# Patient Record
Sex: Female | Born: 1950 | Race: White | Hispanic: No | Marital: Married | Smoking: Never smoker
Health system: Southern US, Community
[De-identification: ages and names within clinical notes are randomized; demographics above are authoritative.]

## PROBLEM LIST (undated history)

## (undated) DIAGNOSIS — G8929 Other chronic pain: Secondary | ICD-10-CM

## (undated) DIAGNOSIS — J189 Pneumonia, unspecified organism: Secondary | ICD-10-CM

## (undated) DIAGNOSIS — R0602 Shortness of breath: Secondary | ICD-10-CM

## (undated) DIAGNOSIS — M1990 Unspecified osteoarthritis, unspecified site: Secondary | ICD-10-CM

## (undated) DIAGNOSIS — H9190 Unspecified hearing loss, unspecified ear: Secondary | ICD-10-CM

## (undated) DIAGNOSIS — G4730 Sleep apnea, unspecified: Secondary | ICD-10-CM

## (undated) DIAGNOSIS — N816 Rectocele: Secondary | ICD-10-CM

## (undated) DIAGNOSIS — N393 Stress incontinence (female) (male): Secondary | ICD-10-CM

## (undated) DIAGNOSIS — E039 Hypothyroidism, unspecified: Secondary | ICD-10-CM

## (undated) DIAGNOSIS — N8110 Cystocele, unspecified: Secondary | ICD-10-CM

## (undated) DIAGNOSIS — Z974 Presence of external hearing-aid: Secondary | ICD-10-CM

## (undated) DIAGNOSIS — K589 Irritable bowel syndrome without diarrhea: Secondary | ICD-10-CM

## (undated) DIAGNOSIS — Z973 Presence of spectacles and contact lenses: Secondary | ICD-10-CM

## (undated) DIAGNOSIS — E079 Disorder of thyroid, unspecified: Secondary | ICD-10-CM

## (undated) HISTORY — DX: Sleep apnea, unspecified: G47.30

## (undated) HISTORY — PX: TONSILLECTOMY: SUR1361

## (undated) HISTORY — DX: Unspecified osteoarthritis, unspecified site: M19.90

## (undated) HISTORY — DX: Shortness of breath: R06.02

## (undated) HISTORY — PX: THROAT SURGERY: SHX803

## (undated) HISTORY — DX: Hypothyroidism, unspecified: E03.9

## (undated) HISTORY — DX: Cystocele, unspecified: N81.10

## (undated) HISTORY — DX: Unspecified hearing loss, unspecified ear: H91.90

## (undated) HISTORY — DX: Rectocele: N81.6

## (undated) HISTORY — DX: Stress incontinence (female) (male): N39.3

---

## 2000-08-06 ENCOUNTER — Encounter (INDEPENDENT_AMBULATORY_CARE_PROVIDER_SITE_OTHER): Payer: Self-pay

## 2000-08-06 ENCOUNTER — Other Ambulatory Visit: Admission: RE | Admit: 2000-08-06 | Discharge: 2000-08-06 | Payer: Self-pay | Admitting: *Deleted

## 2000-12-19 ENCOUNTER — Encounter: Payer: Self-pay | Admitting: Unknown Physician Specialty

## 2000-12-19 ENCOUNTER — Ambulatory Visit (HOSPITAL_COMMUNITY): Admission: RE | Admit: 2000-12-19 | Discharge: 2000-12-19 | Payer: Self-pay | Admitting: Unknown Physician Specialty

## 2002-03-18 ENCOUNTER — Other Ambulatory Visit: Admission: RE | Admit: 2002-03-18 | Discharge: 2002-03-18 | Payer: Self-pay | Admitting: Obstetrics and Gynecology

## 2002-06-10 ENCOUNTER — Ambulatory Visit (HOSPITAL_COMMUNITY): Admission: RE | Admit: 2002-06-10 | Discharge: 2002-06-10 | Payer: Self-pay | Admitting: Family Medicine

## 2002-06-10 ENCOUNTER — Emergency Department (HOSPITAL_COMMUNITY): Admission: EM | Admit: 2002-06-10 | Discharge: 2002-06-10 | Payer: Self-pay | Admitting: Emergency Medicine

## 2002-06-10 ENCOUNTER — Encounter: Payer: Self-pay | Admitting: Family Medicine

## 2002-06-10 ENCOUNTER — Encounter: Payer: Self-pay | Admitting: Emergency Medicine

## 2002-06-25 ENCOUNTER — Encounter (HOSPITAL_COMMUNITY): Admission: RE | Admit: 2002-06-25 | Discharge: 2002-07-25 | Payer: Self-pay | Admitting: Family Medicine

## 2002-06-26 ENCOUNTER — Encounter: Payer: Self-pay | Admitting: Family Medicine

## 2003-04-16 ENCOUNTER — Other Ambulatory Visit: Admission: RE | Admit: 2003-04-16 | Discharge: 2003-04-16 | Payer: Self-pay | Admitting: Unknown Physician Specialty

## 2003-04-16 ENCOUNTER — Other Ambulatory Visit: Admission: RE | Admit: 2003-04-16 | Discharge: 2003-04-16 | Payer: Self-pay | Admitting: Obstetrics and Gynecology

## 2003-05-07 ENCOUNTER — Ambulatory Visit (HOSPITAL_COMMUNITY): Admission: RE | Admit: 2003-05-07 | Discharge: 2003-05-07 | Payer: Self-pay | Admitting: Obstetrics and Gynecology

## 2003-05-29 ENCOUNTER — Emergency Department (HOSPITAL_COMMUNITY): Admission: EM | Admit: 2003-05-29 | Discharge: 2003-05-29 | Payer: Self-pay | Admitting: Emergency Medicine

## 2004-08-31 ENCOUNTER — Ambulatory Visit (HOSPITAL_COMMUNITY): Admission: RE | Admit: 2004-08-31 | Discharge: 2004-08-31 | Payer: Self-pay | Admitting: Obstetrics and Gynecology

## 2004-08-31 ENCOUNTER — Encounter: Payer: Self-pay | Admitting: Obstetrics and Gynecology

## 2004-09-06 ENCOUNTER — Other Ambulatory Visit: Admission: RE | Admit: 2004-09-06 | Discharge: 2004-09-06 | Payer: Self-pay | Admitting: Obstetrics and Gynecology

## 2005-06-26 ENCOUNTER — Ambulatory Visit: Admission: RE | Admit: 2005-06-26 | Discharge: 2005-06-26 | Payer: Self-pay | Admitting: Family Medicine

## 2005-07-04 ENCOUNTER — Ambulatory Visit: Payer: Self-pay | Admitting: Pulmonary Disease

## 2005-10-31 ENCOUNTER — Ambulatory Visit (HOSPITAL_COMMUNITY): Admission: RE | Admit: 2005-10-31 | Discharge: 2005-10-31 | Payer: Self-pay | Admitting: Obstetrics and Gynecology

## 2005-12-22 ENCOUNTER — Other Ambulatory Visit: Admission: RE | Admit: 2005-12-22 | Discharge: 2005-12-22 | Payer: Self-pay | Admitting: Obstetrics and Gynecology

## 2006-12-20 ENCOUNTER — Ambulatory Visit (HOSPITAL_COMMUNITY): Admission: RE | Admit: 2006-12-20 | Discharge: 2006-12-20 | Payer: Self-pay | Admitting: Obstetrics and Gynecology

## 2007-04-21 ENCOUNTER — Ambulatory Visit: Admission: RE | Admit: 2007-04-21 | Discharge: 2007-04-21 | Payer: Self-pay | Admitting: Hematology & Oncology

## 2007-05-01 ENCOUNTER — Ambulatory Visit: Payer: Self-pay | Admitting: Pulmonary Disease

## 2007-06-11 ENCOUNTER — Ambulatory Visit (HOSPITAL_COMMUNITY): Admission: RE | Admit: 2007-06-11 | Discharge: 2007-06-11 | Payer: Self-pay | Admitting: Obstetrics and Gynecology

## 2008-01-22 ENCOUNTER — Ambulatory Visit (HOSPITAL_COMMUNITY): Admission: RE | Admit: 2008-01-22 | Discharge: 2008-01-22 | Payer: Self-pay | Admitting: Family Medicine

## 2009-01-26 ENCOUNTER — Ambulatory Visit (HOSPITAL_COMMUNITY): Admission: RE | Admit: 2009-01-26 | Discharge: 2009-01-26 | Payer: Self-pay | Admitting: Family Medicine

## 2009-01-27 ENCOUNTER — Ambulatory Visit (HOSPITAL_COMMUNITY): Admission: RE | Admit: 2009-01-27 | Discharge: 2009-01-27 | Payer: Self-pay | Admitting: Obstetrics and Gynecology

## 2009-08-20 ENCOUNTER — Encounter: Admission: RE | Admit: 2009-08-20 | Discharge: 2009-08-20 | Payer: Self-pay | Admitting: Otolaryngology

## 2010-02-14 ENCOUNTER — Ambulatory Visit (HOSPITAL_COMMUNITY): Admission: RE | Admit: 2010-02-14 | Discharge: 2010-02-14 | Payer: Self-pay | Admitting: Obstetrics and Gynecology

## 2010-06-05 ENCOUNTER — Encounter: Payer: Self-pay | Admitting: Obstetrics and Gynecology

## 2010-09-30 NOTE — Procedures (Signed)
Sherry Reed, NAND              ACCOUNT NO.:  [id number]   MEDICAL RECORD NO.:  [id number]          PATIENT TYPE:  OUT   LOCATION:  SLEEP LAB                     FACILITY:  [facility]   PHYSICIAN:  [name], MD,FCCPDATE OF BIRTH:  06[birth year]13[birth year]1948   DATE OF STUDY:                            NOCTURNAL POLYSOMNOGRAM   REFERRING PHYSICIAN:   REFERRING PHYSICIAN:  [name]. [name], M.D.   INDICATION FOR STUDY:  Hypersomnia with sleep apnea.   EPWORTH SLEEPINESS SCORE:  1.   SLEEP ARCHITECTURE:  The patient had a total sleep time of 201 minutes  during the entire night with very little REM or slow wave sleep.  Sleep  onset latency was prolonged at 39 minutes and REM onset was normal.  Sleep efficiency was very poor.   RESPIRATORY DATA:  The patient underwent split-night study where she was  found to have 51 obstructive events in the first 88 minutes of sleep.  This gave her an apnea/hypopnea index during the diagnostic portion of  the study at 35 events per hour.  Events occurred only in the supine  position and there was very loud snoring noted throughout.  By protocol,  she was then placed on CPAP with mask type not documented by the sleep  technician.  The pressure was initiated at 5 cm of water and ultimately  increased to 6 cm for breakthrough snoring.  The patient took  approximately 2.5 hours to get back to sleep once the CPAP mask was  placed.  She did achieve REM in the nonsupine position, but not while  supine.   OXYGEN DATA:  There was O2 desaturation during the diagnostic portion of  the study as low as 80%.   CARDIAC DATA:  Occasional PVCs, but otherwise no clinically significant  arrhythmias were noted.   MOVEMENT-PARASOMNIA:  None.   IMPRESSIONS-RECOMMENDATIONS:  1. Moderate-severe obstructive sleep apnea during the diagnostic      portion of the study with an apnea/hypopnea index of 35 events per      hour and O2 desaturation as low as 80%.  These occurred  primarily      in the supine position and very loud snoring was noted.  The      patient was then placed on CPAP and titrated to 6 cm of water      pressure with good control of her events.  However, the patient      never achieved REM in the supine position while on CPAP.  It should      also be noted the patient took 2.5 hours to get back to sleep with      CPAP.  She may need a desensitization protocol in order to improve      tolerance.  It is also unclear from the study whether 6 cm      of pressure will be adequate when she achieves supine REM.  2. Occasional PVCs, but no clinically significant arrhythmia noted.      [name], MD,FCCP  Diplomate, American Board of Sleep  Medicine  Electronically Signed     [facility]/MEDQ  D:  05/01/2007 08:00:39  T:  05/01/2007 16:10:96  Job:  [id number]

## 2010-09-30 NOTE — Procedures (Signed)
NAMEMYRLA, Sherry Reed              ACCOUNT NO.:  [id number]   MEDICAL RECORD NO.:  [id number]          PATIENT TYPE:  OUT   LOCATION:  SLEEP LAB                     FACILITY:  [facility]   PHYSICIAN:  Marcelyn Bruins, M.D. [facility] DATE OF BIRTH:  09-Feb-1951   DATE OF STUDY:  06/26/2005                              NOCTURNAL POLYSOMNOGRAM   REFERRING PHYSICIAN:  Dr. Butch Penny   DATE OF STUDY:  June 26, 2005   INDICATION FOR STUDY:  Hypersomnia with sleep apnea.   EPWORTH SCORE:  9   SLEEP ARCHITECTURE:  The patient had a total sleep time of 225 minutes with  adequate slow wave sleep but decreased REM. Sleep onset latency was  prolonged at 59 minutes and REM onset was at the lower limits of normal at  62 minutes. Sleep efficiency was very poor at 57%.   RESPIRATORY DATA:  The patient was found to have 37 hypopneas and 29 apneas  for a Respiratory Disturbance Index of 18 events per hour. The events  occurred in all body positions and there was loud snoring noted.   OXYGEN DATA:  The patient had an O2 desaturation as low as 86% with her  obstructive events.   CARDIAC DATA:  No clinically significant cardiac arrhythmia.   MOVEMENT/PARASOMNIA:  There were no clinically significant movements or  abnormal behaviors noted.   IMPRESSION/RECOMMENDATIONS:  Mild to moderate obstructive sleep apnea with a  Respiratory Disturbance Index of 18 events per hour and O2 desaturation as  low as 86%. Treatment for this degree of sleep apnea may include weight loss  alone if applicable, upper airway surgery, oral appliance, and also  continuous positive airway pressure. Clinical correlation is suggested.                                            ______________________________  Marcelyn Bruins, M.D. [facility]  Diplomate, American Board of Sleep  Medicine     KC/MEDQ  D:  07/03/2005 16:05:55  T:  07/03/2005 22:26:38  Job:  [id number]

## 2010-12-29 ENCOUNTER — Ambulatory Visit (HOSPITAL_COMMUNITY)
Admission: RE | Admit: 2010-12-29 | Discharge: 2010-12-29 | Disposition: A | Discharge status: Home / Self Care | Payer: BC Managed Care – PPO | Source: Ambulatory Visit | Attending: Internal Medicine | Admitting: Internal Medicine

## 2010-12-29 ENCOUNTER — Other Ambulatory Visit (HOSPITAL_COMMUNITY): Payer: Self-pay | Admitting: Internal Medicine

## 2010-12-29 DIAGNOSIS — J189 Pneumonia, unspecified organism: Secondary | ICD-10-CM

## 2011-03-29 ENCOUNTER — Other Ambulatory Visit (HOSPITAL_COMMUNITY): Payer: Self-pay | Admitting: Obstetrics and Gynecology

## 2011-03-29 DIAGNOSIS — Z139 Encounter for screening, unspecified: Secondary | ICD-10-CM

## 2011-04-03 ENCOUNTER — Ambulatory Visit (HOSPITAL_COMMUNITY): Payer: BC Managed Care – PPO

## 2011-04-04 ENCOUNTER — Ambulatory Visit (HOSPITAL_COMMUNITY)
Admission: RE | Admit: 2011-04-04 | Discharge: 2011-04-04 | Disposition: A | Discharge status: Home / Self Care | Payer: BC Managed Care – PPO | Source: Ambulatory Visit | Attending: Obstetrics and Gynecology | Admitting: Obstetrics and Gynecology

## 2011-04-04 DIAGNOSIS — Z1231 Encounter for screening mammogram for malignant neoplasm of breast: Secondary | ICD-10-CM | POA: Insufficient documentation

## 2011-04-04 DIAGNOSIS — Z139 Encounter for screening, unspecified: Secondary | ICD-10-CM

## 2011-06-24 ENCOUNTER — Encounter: Payer: Self-pay | Admitting: Obstetrics and Gynecology

## 2011-06-24 DIAGNOSIS — T839XXA Unspecified complication of genitourinary prosthetic device, implant and graft, initial encounter: Secondary | ICD-10-CM

## 2011-06-24 DIAGNOSIS — E0500 Thyrotoxicosis with diffuse goiter without thyrotoxic crisis or storm: Secondary | ICD-10-CM

## 2011-06-24 DIAGNOSIS — M8580 Other specified disorders of bone density and structure, unspecified site: Secondary | ICD-10-CM

## 2011-08-03 ENCOUNTER — Ambulatory Visit (INDEPENDENT_AMBULATORY_CARE_PROVIDER_SITE_OTHER): Payer: BC Managed Care – PPO | Admitting: Otolaryngology

## 2011-08-03 ENCOUNTER — Ambulatory Visit (INDEPENDENT_AMBULATORY_CARE_PROVIDER_SITE_OTHER): Payer: BC Managed Care – PPO | Admitting: Obstetrics and Gynecology

## 2011-08-03 DIAGNOSIS — H903 Sensorineural hearing loss, bilateral: Secondary | ICD-10-CM

## 2011-08-03 DIAGNOSIS — Z01419 Encounter for gynecological examination (general) (routine) without abnormal findings: Secondary | ICD-10-CM

## 2011-08-03 DIAGNOSIS — H6980 Other specified disorders of Eustachian tube, unspecified ear: Secondary | ICD-10-CM

## 2011-10-12 ENCOUNTER — Ambulatory Visit (INDEPENDENT_AMBULATORY_CARE_PROVIDER_SITE_OTHER): Payer: BC Managed Care – PPO | Admitting: Otolaryngology

## 2011-10-12 DIAGNOSIS — H814 Vertigo of central origin: Secondary | ICD-10-CM

## 2011-10-12 DIAGNOSIS — R42 Dizziness and giddiness: Secondary | ICD-10-CM

## 2011-10-12 DIAGNOSIS — H903 Sensorineural hearing loss, bilateral: Secondary | ICD-10-CM

## 2011-10-20 ENCOUNTER — Other Ambulatory Visit: Payer: Self-pay | Admitting: Obstetrics and Gynecology

## 2011-10-31 NOTE — Telephone Encounter (Signed)
Can pt have refill on rx 

## 2011-11-06 ENCOUNTER — Telehealth: Payer: Self-pay | Admitting: Obstetrics and Gynecology

## 2011-11-06 NOTE — Telephone Encounter (Signed)
Triage/cht received 

## 2011-11-08 ENCOUNTER — Telehealth: Payer: Self-pay | Admitting: Obstetrics and Gynecology

## 2011-11-08 NOTE — Telephone Encounter (Signed)
Sherry Reed/SR pt?/rx

## 2011-11-13 ENCOUNTER — Other Ambulatory Visit: Payer: Self-pay

## 2011-11-13 MED ORDER — IBANDRONATE SODIUM 150 MG PO TABS: 150.0000 mg | ORAL_TABLET | ORAL | Status: DC

## 2011-11-13 NOTE — Telephone Encounter (Signed)
rx refilled ok per SR  ld

## 2011-11-13 NOTE — Telephone Encounter (Signed)
Waiting for ok from SR.  ld

## 2011-11-13 NOTE — Telephone Encounter (Signed)
OK to refill Boniva until next AEX

## 2011-11-14 ENCOUNTER — Telehealth: Payer: Self-pay

## 2011-11-14 NOTE — Telephone Encounter (Signed)
LM for  pt that we are unable to get Boniva to go through on escribe.  Will call in to pharmacy that is listed in chart.  ld

## 2011-12-25 NOTE — Telephone Encounter (Signed)
Is this still needed 

## 2012-05-20 ENCOUNTER — Other Ambulatory Visit: Payer: Self-pay | Admitting: Obstetrics and Gynecology

## 2012-05-20 DIAGNOSIS — Z139 Encounter for screening, unspecified: Secondary | ICD-10-CM

## 2012-05-23 ENCOUNTER — Ambulatory Visit (HOSPITAL_COMMUNITY)
Admission: RE | Admit: 2012-05-23 | Discharge: 2012-05-23 | Disposition: A | Discharge status: Home / Self Care | Payer: BC Managed Care – PPO | Source: Ambulatory Visit | Attending: Obstetrics and Gynecology | Admitting: Obstetrics and Gynecology

## 2012-05-23 DIAGNOSIS — Z1231 Encounter for screening mammogram for malignant neoplasm of breast: Secondary | ICD-10-CM | POA: Insufficient documentation

## 2012-05-23 DIAGNOSIS — Z139 Encounter for screening, unspecified: Secondary | ICD-10-CM

## 2012-06-17 ENCOUNTER — Other Ambulatory Visit (HOSPITAL_COMMUNITY): Payer: Self-pay | Admitting: Internal Medicine

## 2012-06-17 DIAGNOSIS — R0600 Dyspnea, unspecified: Secondary | ICD-10-CM

## 2012-06-24 ENCOUNTER — Ambulatory Visit (HOSPITAL_COMMUNITY)
Admission: RE | Admit: 2012-06-24 | Discharge: 2012-06-24 | Disposition: A | Discharge status: Home / Self Care | Payer: BC Managed Care – PPO | Source: Ambulatory Visit | Attending: Internal Medicine | Admitting: Internal Medicine

## 2012-06-24 DIAGNOSIS — R0600 Dyspnea, unspecified: Secondary | ICD-10-CM

## 2012-06-24 DIAGNOSIS — R0602 Shortness of breath: Secondary | ICD-10-CM | POA: Insufficient documentation

## 2012-06-24 NOTE — Progress Notes (Signed)
2D Echo Performed 06/24/2012    Oveda Dadamo, RCS  

## 2013-02-17 ENCOUNTER — Ambulatory Visit (HOSPITAL_COMMUNITY)
Admission: RE | Admit: 2013-02-17 | Discharge: 2013-02-17 | Disposition: A | Discharge status: Home / Self Care | Payer: BC Managed Care – PPO | Source: Ambulatory Visit | Attending: Orthopaedic Surgery | Admitting: Orthopaedic Surgery

## 2013-02-17 DIAGNOSIS — M545 Low back pain, unspecified: Secondary | ICD-10-CM | POA: Insufficient documentation

## 2013-02-17 DIAGNOSIS — M25561 Pain in right knee: Secondary | ICD-10-CM | POA: Insufficient documentation

## 2013-02-17 DIAGNOSIS — M25569 Pain in unspecified knee: Secondary | ICD-10-CM | POA: Insufficient documentation

## 2013-02-17 DIAGNOSIS — R269 Unspecified abnormalities of gait and mobility: Secondary | ICD-10-CM | POA: Insufficient documentation

## 2013-02-17 DIAGNOSIS — IMO0001 Reserved for inherently not codable concepts without codable children: Secondary | ICD-10-CM | POA: Insufficient documentation

## 2013-02-17 DIAGNOSIS — M2241 Chondromalacia patellae, right knee: Secondary | ICD-10-CM | POA: Insufficient documentation

## 2013-02-17 NOTE — Evaluation (Signed)
Physical Therapy Evaluation  Patient Details  Name: Sherry Reed MRN: [id number] Date of Birth: 01/22/1951  Today's Date: 02/17/2013 Time: 4098-1191 PT Time Calculation (min): 30 min              Visit#: 1 of 8  Re-eval: 03/19/13 Assessment Diagnosis: Low back pain Next MD Visit: Dr. Magnus Ivan - October 27  Subjective Symptoms/Limitations Symptoms: Pt is a 62 year old female referred to PT for low back pain and knee pain.  She reports that her pain had started about 20 years ago of insidous onset and had feeling of "cement in her legs"   She reports she remembers that she had her pain and had to have her family take care of her for about 2 weeks because the pain was so painful.  She has noticed in the past few months in the pain has been increasing. She reports that she was climbing hills this summer and when she was going down the hills she had increased pain.  Patient Stated Goals: Knowing preventative measures, and getting new exercises, know how to lift correctly Pain Assessment Currently in Pain?: Yes Pain Score:  (Avg: 3/10) Pain Location: Back Pain Type: Chronic pain Pain Onset: More than a month ago Pain Frequency: Intermittent Pain Relieving Factors: using awareness about what she is carrying and what she is lifting.  Multiple Pain Sites: Yes  Precautions/Restrictions     Balance Screening Balance Screen Has the patient fallen in the past 6 months: No Has the patient had a decrease in activity level because of a fear of falling? : No Is the patient reluctant to leave their home because of a fear of falling? : No  Prior Functioning  Prior Function Vocation: Retired Comments: Takes care of her 7,46 and 33 year old grandchildren, enjoys traveling, going out with friends.   Cognition/Observation Observation/Other Assessments Observations: wears compression undergarmetns hitting at L4-5  Sensation/Coordination/Flexibility/Functional Tests  FOTO: Status: 54%,  Limitation: 46%  Assessment RLE Assessment RLE Assessment: Within Functional Limits LLE Assessment LLE Assessment: Within Functional Limits Lumbar AROM Lumbar Flexion: decreased 10% - pain at end range Lumbar Extension: WNL Lumbar - Right Side Bend: WNL Lumbar - Left Side Bend: WNL Lumbar - Right Rotation: decreased 25% - mild pain Lumbar - Left Rotation: decreased 25% Lumbar Strength Overall Lumbar Strength Comments: Quadratus 3/5 L and R Palpation Palpation: pain and tenderness to L4-L5 spinous process; Pain and tenderness with moderate fascial restrictions to Rt lateral patella distal ITB.   Mobility/Balance  Static Standing Balance Static Standing - Comment/# of Minutes: impaired hip straegy Single Leg Stance - Right Leg: 6 Single Leg Stance - Left Leg: 6 Tandem Stance - Right Leg: 8 Tandem Stance - Left Leg: 8 Rhomberg - Eyes Opened: 10 Rhomberg - Eyes Closed: 10   Exercise/Treatments Supine Ab Set: 5 reps;Limitations AB Set Limitations: 10 sec holds w/NMR Bent Knee Raise: 5 reps;Limitations Bent Knee Raise Limitations: w/ab set Prone  Single Arm Raise: 5 reps Straight Leg Raise: 5 reps Opposite Arm/Leg Raise: Right arm/Left leg;Left arm/Right leg;5 reps    Physical Therapy Assessment and Plan  PT Assessment and Plan  Clinical Impression Statement: Pt is a 62 year old female referred to PT for LBP and Rt knee pain with impairments listed below. At this time her low back pain is likely due to decreased strength to lumbar and gluteal musculature and her compression undergarments may also be placing increased stress on the L4-L5 spinous process. Discussed with pt about  changing undergarment to decrease load on spinous process. Rt knee pain is worse with eccentric control activities  Rehab Potential: Good  PT Frequency: Min 2X/week  PT Duration: 4 weeks  PT Treatment/Interventions: Gait training;Stair training;Functional mobility training;Therapeutic  activities;Therapeutic exercise;Balance training;Neuromuscular re-education;Patient/family education;Manual techniques;Modalities  PT Plan: core stablization, hamstring, quadricep, ITB stretching, education on proper posture; [place] for Rt lateral knee pain relief Show patellar mobs and manual techniques as needed. Progress towards balance and gait activities.  Goals  Home Exercise Program  Pt/caregiver will Perform Home Exercise Program: Independently  PT Goal: Perform Home Exercise Program - Progress: Goal set today  PT Short Term Goals  Time to Complete Short Term Goals: 2 weeks  PT Short Term Goal 1: Pt will report low back pain less than 2/10 for 75% of her day and Rt knee pain less than 4/10 with descending stairs.  PT Short Term Goal 2: Pt will be educated on patellar mobilizations and self massage to decrease fascial restrictions to lateral knee.  PT Short Term Goal 3: Pt will improve her core and LE flexibility in order to present with normalized lumbar AROM to progress towards proper lifting techniques.  PT Short Term Goal 4: Pt will be educated in proper posture and will begin postural strengthening to progress towards LTG.  PT Long Term Goals  Time to Complete Long Term Goals: 4 weeks  PT Long Term Goal 1: Pt will be educated in lifting program to decrease risk of secondary injuries.  PT Long Term Goal 2: Pt will improve her FOTO score to status greater than 64% and Limitation less than 36% for improved percieved functional ability.  Long Term Goal 3: Pt will improve her BLE and core strength to [facility] in order to tolerate ambulating outdoors for greater than 30 minutes with decreased pain to low back and Rt knee to return to lesiure activities.    Patient Active Problem List   Diagnosis Date Noted  . Low back pain 02/17/2013  . Right knee pain 02/17/2013  . 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 IUD complication 06/24/2011  . Grave's disease 06/24/2011  . Osteopenia  06/24/2011    PT Plan of Care PT Home Exercise Plan: given PT Patient Instructions: importance of posture, and HEP Consulted and Agree with Plan of Care: Patient  GP    Denajah Farias, MPT, ATC 02/17/2013, 10:26 AM  Physician Documentation Your signature is required to indicate approval of the treatment plan as stated above.  Please sign and either send electronically or make a copy of this report for your files and return this physician signed original.   Please mark one 1.__approve of plan  2. ___approve of plan with the following conditions.   ______________________________                                                          _____________________ Physician Signature  Date  

## 2013-02-21 ENCOUNTER — Ambulatory Visit (HOSPITAL_COMMUNITY): Payer: BC Managed Care – PPO | Admitting: Physical Therapy

## 2013-02-24 ENCOUNTER — Ambulatory Visit (HOSPITAL_COMMUNITY)
Admission: RE | Admit: 2013-02-24 | Discharge: 2013-02-24 | Disposition: A | Discharge status: Home / Self Care | Payer: BC Managed Care – PPO | Source: Ambulatory Visit | Attending: Orthopaedic Surgery | Admitting: Orthopaedic Surgery

## 2013-02-24 DIAGNOSIS — M25561 Pain in right knee: Secondary | ICD-10-CM

## 2013-02-24 DIAGNOSIS — M2241 Chondromalacia patellae, right knee: Secondary | ICD-10-CM

## 2013-02-24 DIAGNOSIS — M545 Low back pain: Secondary | ICD-10-CM

## 2013-02-24 NOTE — Progress Notes (Signed)
Physical Therapy Treatment Patient Details  Name: Sherry Reed MRN: [id number] Date of Birth: April 29, 1951  Today's Date: 02/24/2013 Time: 4696-2952 PT Time Calculation (min): 45 min Charges:  TE: [phone] Self Care: 1005-1010 [place]: 1010-1018 Visit#: 2 of 8  Re-eval: 03/19/13    Subjective: Symptoms/Limitations Symptoms: Pt reports that she has slight pain to her knee and her back is not to bad today.  States that she has tried the exercises but has not completed them like she should. "I cant believe how hard it is to go from sit to stand." Pain Assessment Currently in Pain?: No/denies Pain Location: Back  Precautions/Restrictions     Exercise/Treatments Stretches Passive Hamstring Stretch: 3 reps;30 seconds ITB Stretch: 3 reps;30 seconds Piriformis Stretch: 2 reps;30 seconds;Limitations Piriformis Stretch Limitations: Supine, figure 4 BLE Supine Ab Set: 10 reps;Limitations AB Set Limitations: PT faciliation for activation w/VC and TC 10 sec holds Clam: 10 reps;Limitations Clam Limitations: w/ab set Bent Knee Raise: 10 reps;Limitations Bent Knee Raise Limitations: w/ab set Bridge: 10 reps Straight Leg Raise: 10 reps;Limitations Straight Leg Raises Limitations: BLE w/ab set Sidelying Hip Abduction: 10 reps;Limitations Hip Abduction Limitations: Hip adduction 10 reps  Modalities Modalities: Ultrasound Ultrasound Ultrasound Location: Rt lateral knee. Ultrasound Parameters: 8 minutes, 3 Mhz (medium head), 0.8 w/cm2  Physical Therapy Assessment and Plan PT Assessment and Plan Clinical Impression Statement: Treatment focused on establishment of HEP.  Pt initally requires mod cueing for core stabalization and activation and is able to complete independently by end of treatment. Pt independent with patellar mobs at end of session.  Pt reports decreased Rt knee pain after [place].  PT Plan: F/U on [place] tx, start with prone exercises for core statblizations and quardriceps  stretching for HEP. add standing squats, heel/toe raises, side lunges for postural strengthening.  Use teach back for proper posture, 3rd visit: add therabands, dead bugs and BUE shoulder flexion.     Goals Home Exercise Program Pt/caregiver will Perform Home Exercise Program: Independently PT Goal: Perform Home Exercise Program - Progress: Progressing toward goal PT Short Term Goals Time to Complete Short Term Goals: 2 weeks PT Short Term Goal 1: Pt will report low back pain less than 2/10 for 75% of her day and Rt knee pain less than 4/10 with descending stairs.  PT Short Term Goal 1 - Progress: Progressing toward goal PT Short Term Goal 2: Pt will be educated on patellar mobilizations and self massage to decrease fascial restrictions to lateral knee.  PT Short Term Goal 2 - Progress: Met PT Short Term Goal 3: Pt will improve her core and LE flexibility in order to present with normalized lumbar AROM to progress towards proper lifting techniques.  PT Short Term Goal 3 - Progress: Progressing toward goal PT Short Term Goal 4: Pt will be educated in proper posture and will begin postural strengthening to progress towards LTG.  PT Short Term Goal 4 - Progress: Progressing toward goal PT Long Term Goals Time to Complete Long Term Goals: 4 weeks PT Long Term Goal 1: Pt will be educated in lifting program to decrease risk of secondary injuries.  PT Long Term Goal 2: Pt will improve her FOTO score to status greater than 64% and Limitation less than 36% for improved percieved functional ability.  Long Term Goal 3: Pt will improve her BLE and core strength to [facility] in order to tolerate ambulating outdoors for greater than 30 minutes with decreased pain to low back and Rt knee to return to lesiure  activities.   Problem List Patient Active Problem List   Diagnosis Date Noted  . Low back pain 02/17/2013  . Right knee pain 02/17/2013  . 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 IUD  complication 06/24/2011  . Grave's disease 06/24/2011  . Osteopenia 06/24/2011    PT Plan of Care PT Patient Instructions: independent demonstration of patellar mobilizations, explained proper posture Consulted and Agree with Plan of Care: Patient  GP    Makaiya Geerdes, MPT,ATC 02/24/2013, 10:30 AM

## 2013-02-27 ENCOUNTER — Ambulatory Visit (HOSPITAL_COMMUNITY)
Admission: RE | Admit: 2013-02-27 | Discharge: 2013-02-27 | Disposition: A | Discharge status: Home / Self Care | Payer: BC Managed Care – PPO | Source: Ambulatory Visit | Attending: Orthopaedic Surgery | Admitting: Orthopaedic Surgery

## 2013-02-27 NOTE — Progress Notes (Signed)
Physical Therapy Treatment Patient Details  Name: Sherry Reed MRN: [id number] Date of Birth: 11/17/1950  Today's Date: 02/27/2013 Time: 4098-1191 PT Time Calculation (min): 53 min Visit#: 3 of 8  Re-eval: 03/19/13 Charges:  therex [phone] (28') , manual 1012-1022 (10') , [place] [phone] (8')   Subjective: Symptoms/Limitations Symptoms: Pt statess her major pain is when she's transitioning from sit to stand and feels a sharp pain up to 3/10 and feels it goes inward.   Exercise/Treatments Stretches Active Hamstring Stretch: 2 reps;30 seconds Quadruped Mid Back Stretch: 2 reps;30 seconds ITB Stretch: 2 reps;30 seconds Piriformis Stretch: 2 reps;30 seconds Piriformis Stretch Limitations: Supine, figure 4 BLE Supine Ab Set: 10 reps;Limitations AB Set Limitations: PT faciliation for activation w/VC and TC 10 sec holds Bent Knee Raise: 10 reps;Limitations Bent Knee Raise Limitations: w/ab set Bridge: 10 reps Straight Leg Raise: 10 reps Straight Leg Raises Limitations: BLE w/ab set Sidelying Clam: 10 reps;5 seconds Hip Abduction: 15 reps Prone  Single Arm Raise: 10 reps Straight Leg Raise: 10 reps Opposite Arm/Leg Raise: Right arm/Left leg;Left arm/Right leg;5 reps    Modalities Modalities: Ultrasound Ultrasound Ultrasound Location: Rt lateral knee  Ultrasound Parameters: 8 minutes, 3 MHz at 0.8 w/cm 2 Ultrasound Goals: Pain MFR to Rt Lateral knee to decrease adhesions X 10 minutes  Physical Therapy Assessment and Plan PT Assessment and Plan Pt Assessment:  Pt with notable Rt VMO weakeness with palpable atrophy.  Discussed with patient adding strengthening exercises for this next visit.  Added MFR to lateral knee to decrease adhesions.  Pt requires therapist facilitation to perform most stretches correctly. PT Plan: Add Rt VMO strengthening exercises, standing wall squats with ball between knees, heel/toe raises, side lunges (with knee in neutral) for postural  strengthening.  Progress to therabands, dead bugs and BUE shoulder flexion.      Problem List Patient Active Problem List   Diagnosis Date Noted  . Low back pain 02/17/2013  . Right knee pain 02/17/2013  . 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 IUD complication 06/24/2011  . Grave's disease 06/24/2011  . Osteopenia 06/24/2011    PT Plan of Care PT Patient Instructions: independent demonstration of patellar mobilizations, explained proper posture Consulted and Agree with Plan of Care: Patient   Lurena Nida, PTA/CLT 02/27/2013, 12:10 PM

## 2013-03-03 ENCOUNTER — Ambulatory Visit (HOSPITAL_COMMUNITY)
Admission: RE | Admit: 2013-03-03 | Discharge: 2013-03-03 | Disposition: A | Discharge status: Home / Self Care | Payer: BC Managed Care – PPO | Source: Ambulatory Visit | Attending: Orthopaedic Surgery | Admitting: Orthopaedic Surgery

## 2013-03-03 DIAGNOSIS — M25561 Pain in right knee: Secondary | ICD-10-CM

## 2013-03-03 DIAGNOSIS — M545 Low back pain: Secondary | ICD-10-CM

## 2013-03-03 DIAGNOSIS — M2241 Chondromalacia patellae, right knee: Secondary | ICD-10-CM

## 2013-03-03 NOTE — Progress Notes (Addendum)
Physical Therapy Treatment Patient Details  Name: Sherry Reed MRN: [id number] Date of Birth: Sep 16, 1950  Today's Date: 03/03/2013 Time: 0935-1015 PT Time Calculation (min): 40 min Charges:  TE: [phone] [place]: 1007-1015 Visit#: 4 of 8  Re-eval: 03/19/13    Subjective: Symptoms/Limitations Symptoms: Pt reports that she has had some family issues come up this weekend and has not been able to do her exercises like she should. She reports that when she is really active with the grandchildren she still is having pain in her back. Has pain when she is riding in a car to [place] in her knee.  Pain Assessment Currently in Pain?: No/denies  Precautions/Restrictions     Exercise/Treatments Stretches ITB Stretch: 2 reps;30 seconds;Limitations ITB Stretch Limitations: BLE Standing Functional Squats: 10 reps;Limitations Functional Squats Limitations: PT facilaition for proper motion Side Lunge: 10 reps;Limitations Side Lunge Limitations: BLE Row: Both;10 reps;Theraband Theraband Level (Row): Level 3 (Green) Shoulder Extension: Both;10 reps;Theraband Theraband Level (Shoulder Extension): Level 3 (Green) Shoulder ADduction: Both;10 reps;Theraband Theraband Level (Shoulder Adduction): Level 3 (Green) Seated Long [employer] on Chair: Both;Weights;15 reps LAQ on Chair Weights (lbs): 3 Supine Straight Leg Raise: 15 reps Straight Leg Raises Limitations: BLE w/ab set  Modalities Modalities: Ultrasound Ultrasound Ultrasound Location: Rt lateral knee Ultrasound Parameters: 8 minutes, 3 MHz at 0.8 w/cm 2 continous Ultrasound Goals: Pain  Physical Therapy Assessment and Plan PT Assessment and Plan Clinical Impression Statement: Treatment focused on advancing HEP with standing activities to improve postural strength and education on importance of core activation with lifting and standing exercises. Pt requires min cueing for proper LE activitation.  PT Plan: Add Rt VMO strengthening  exercises, standing wall squats with ball between knees, heel/toe raises, side lunges (with knee in neutral) for postural strengthening.  Progress towards proper lifting techniq[employer].     Goals Home Exercise Program Pt/caregiver will Perform Home Exercise Program: Independently PT Goal: Perform Home Exercise Program - Progress: Met PT Short Term Goals Time to Complete Short Term Goals: 2 weeks PT Short Term Goal 1: Pt will report low back pain less than 2/10 for 75% of her day and Rt knee pain less than 4/10 with descending stairs.  PT Short Term Goal 1 - Progress: Partly met PT Short Term Goal 2: Pt will be educated on patellar mobilizations and self massage to decrease fascial restrictions to lateral knee.  PT Short Term Goal 2 - Progress: Met PT Short Term Goal 3: Pt will improve her core and LE flexibility in order to present with normalized lumbar AROM to progress towards proper lifting techniques.  PT Short Term Goal 3 - Progress: Partly met PT Short Term Goal 4: Pt will be educated in proper posture and will begin postural strengthening to progress towards LTG.  PT Short Term Goal 4 - Progress: Met PT Long Term Goals Time to Complete Long Term Goals: 4 weeks PT Long Term Goal 1: Pt will be educated in lifting program to decrease risk of secondary injuries.  PT Long Term Goal 1 - Progress: Progressing toward goal PT Long Term Goal 2: Pt will improve her FOTO score to status greater than 64% and Limitation less than 36% for improved percieved functional ability.  PT Long Term Goal 2 - Progress: Progressing toward goal Long Term Goal 3: Pt will improve her BLE and core strength to [facility] in order to tolerate ambulating outdoors for greater than 30 minutes with decreased pain to low back and Rt knee to return to lesiure activities.  Long Term Goal 3 Progress: Progressing toward goal  Problem List Patient Active Problem List   Diagnosis Date Noted  . Low back pain 02/17/2013  . Right knee  pain 02/17/2013  . 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 IUD complication 06/24/2011  . Grave's disease 06/24/2011  . Osteopenia 06/24/2011    PT Plan of Care PT Home Exercise Plan: provided with green tband and standing postural exercises.  PT Patient Instructions: Discussed patellar mobilizations when sitting in her car.  Consulted and Agree with Plan of Care: Patient  GP    Sherry Reed, MPT, ATC 03/03/2013, 10:25 AM

## 2013-03-06 ENCOUNTER — Ambulatory Visit (HOSPITAL_COMMUNITY)
Admission: RE | Admit: 2013-03-06 | Discharge: 2013-03-06 | Disposition: A | Discharge status: Home / Self Care | Payer: BC Managed Care – PPO | Source: Ambulatory Visit | Attending: Internal Medicine | Admitting: Internal Medicine

## 2013-03-06 DIAGNOSIS — M25561 Pain in right knee: Secondary | ICD-10-CM

## 2013-03-06 DIAGNOSIS — M545 Low back pain: Secondary | ICD-10-CM

## 2013-03-06 DIAGNOSIS — M2241 Chondromalacia patellae, right knee: Secondary | ICD-10-CM

## 2013-03-06 NOTE — Progress Notes (Signed)
Physical Therapy Treatment Patient Details  Name: Sherry Reed MRN: [id number] Date of Birth: 05-24-1950  Today's Date: 03/06/2013 Time: 1028-1110 PT Time Calculation (min): 42 min Charge: TE 102/12-1108  Visit#: 5 of 8  Re-eval: 03/19/13 Assessment Diagnosis: Low back pain Next MD Visit: Dr. Magnus Ivan - October 27  Subjective: Symptoms/Limitations Symptoms: Pain free, compliant with HEP.   Pain Assessment Currently in Pain?: No/denies  Objective:   Exercise/Treatments Standing Heel Raises: 10 reps;5 seconds;Limitations Heel Raises Limitations: tpe raises 10x  Functional Squats: 10 reps;Limitations Side Lunge: 10 reps;Limitations Side Lunge Limitations: BLE Row: Both;10 reps;Theraband Theraband Level (Row): Level 3 (Green) Shoulder Extension: Both;10 reps;Theraband Theraband Level (Shoulder Extension): Level 3 (Green) Shoulder ADduction: Both;10 reps;Theraband Theraband Level (Shoulder Adduction): Level 3 (Green) Supine Straight Leg Raise: 10 reps;5 seconds;Limitations Straight Leg Raises Limitations: ER Bil LE with 2# Other Supine Lumbar Exercises: SAQ with ER 2# Bil LE  Physical Therapy Assessment and Plan PT Assessment and Plan Clinical Impression Statement: Progressed VMO strengthening exercises with good form noted following demonstration with no reports of pain through session.  Pt given updated HEP for LE/VMO strengthening. PT Plan: Continue with current POC for Rt VMO and postural strengthening.  Progress towards proper lifting techniqus.     Goals Home Exercise Program Pt/caregiver will Perform Home Exercise Program: Independently PT Short Term Goals Time to Complete Short Term Goals: 2 weeks PT Short Term Goal 1: Pt will report low back pain less than 2/10 for 75% of her day and Rt knee pain less than 4/10 with descending stairs.  PT Short Term Goal 2: Pt will be educated on patellar mobilizations and self massage to decrease fascial restrictions to  lateral knee.  PT Short Term Goal 3: Pt will improve her core and LE flexibility in order to present with normalized lumbar AROM to progress towards proper lifting techniques.  PT Short Term Goal 4: Pt will be educated in proper posture and will begin postural strengthening to progress towards LTG.  PT Long Term Goals Time to Complete Long Term Goals: 4 weeks PT Long Term Goal 1: Pt will be educated in lifting program to decrease risk of secondary injuries.  PT Long Term Goal 2: Pt will improve her FOTO score to status greater than 64% and Limitation less than 36% for improved percieved functional ability.  Long Term Goal 3: Pt will improve her BLE and core strength to [facility] in order to tolerate ambulating outdoors for greater than 30 minutes with decreased pain to low back and Rt knee to return to lesiure activities.   Problem List Patient Active Problem List   Diagnosis Date Noted  . Low back pain 02/17/2013  . Right knee pain 02/17/2013  . 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 IUD complication 06/24/2011  . Grave's disease 06/24/2011  . Osteopenia 06/24/2011    PT - End of Session Activity Tolerance: Patient tolerated treatment well General Behavior During Therapy: WFL for tasks assessed/performed PT Plan of Care PT Home Exercise Plan: given HEP for LE strengthening  GP    Juel Burrow 03/06/2013, 4:05 PM

## 2013-03-10 ENCOUNTER — Ambulatory Visit (HOSPITAL_COMMUNITY)
Admission: RE | Admit: 2013-03-10 | Discharge: 2013-03-10 | Disposition: A | Discharge status: Home / Self Care | Payer: BC Managed Care – PPO | Source: Ambulatory Visit | Attending: Orthopaedic Surgery | Admitting: Orthopaedic Surgery

## 2013-03-10 NOTE — Progress Notes (Signed)
Physical Therapy Re-evaluation  Patient Details  Name: Sherry Reed MRN: [id number] Date of Birth: January 20, 1951  Today's Date: 03/10/2013 Time: 0865-7846 PT Time Calculation (min): 50 min              Visit#: 6 of 16  Re-eval: 04/07/13 Diagnosis: Low back pain Next MD Visit: Dr. Magnus Ivan - October 27 Charges:  therex 45'  Subjective Pt states she is overall better but still not where she wants to be.   Objective: RLE Strength Right Knee Flexion: 4/5 Right Knee Extension: 4/5  LLE Strength Left Knee Flexion: 5/5 Left Knee Extension: 5/5  Lumbar AROM Lumbar Flexion: decreased 10% - pain at end range Lumbar Extension: WNL Lumbar - Right Side Bend: WNL Lumbar - Left Side Bend: WNL Lumbar - Right Rotation: WNL Lumbar - Left Rotation: WNL with discomfort in end ROM  Exercise/Treatments Standing Heel Raises: 15 reps Heel Raises Limitations: toe raises 15x  Functional Squats: 10 reps;Limitations Functional Squats Limitations: PT facilaition for proper motion Side Lunge: 10 reps;Limitations Side Lunge Limitations: BLE Row: Both;10 reps;Theraband Theraband Level (Row): Level 3 (Green) Shoulder Extension: Both;10 reps;Theraband Theraband Level (Shoulder Extension): Level 3 (Green) Shoulder ADduction: Both;10 reps;Theraband Theraband Level (Shoulder Adduction): Level 3 (Green) Supine Straight Leg Raise: 10 reps;5 seconds;Limitations Straight Leg Raises Limitations: ER Bil LE with 2# Other Supine Lumbar Exercises: SAQ with ER 2# Bil LE   Modalities Modalities: Ultrasound Ultrasound Ultrasound Location: Rt lateral knee Ultrasound Parameters: 8 minutes, 3 MHz at 0.8 w/cm 2 continuous  Physical Therapy Assessment and Plan PT Assessment and Plan Clinical Impression Statement: Pt progressing well; has met all STG's and lumbar ROM is now WNL.  CONTinued Rt VMO weakness, moderate adhesions lateral knee.  Most Rt knee pain voiced when transitioning from sit to stand,  descending stairs not much better.   PT Plan: REcommend to Continue with current POC for Rt VMO and postural strengthening.  Add reciprocal stairs and sit to stand for strengthening. Progress towards proper lifting techniques.     Goals Home Exercise Program Pt/caregiver will Perform Home Exercise Program: Independently PT Goal: Perform Home Exercise Program - Progress: Met PT Short Term Goals Time to Complete Short Term Goals: 2 weeks PT Short Term Goal 1: Pt will report low back pain less than 2/10 for 75% of her day and Rt knee pain less than 4/10 with descending stairs.  PT Short Term Goal 1 - Progress: Met PT Short Term Goal 2: Pt will be educated on patellar mobilizations and self massage to decrease fascial restrictions to lateral knee.  PT Short Term Goal 2 - Progress: Met PT Short Term Goal 3: Pt will improve her core and LE flexibility in order to present with normalized lumbar AROM to progress towards proper lifting techniques.  PT Short Term Goal 3 - Progress: Met PT Short Term Goal 4: Pt will be educated in proper posture and will begin postural strengthening to progress towards LTG.  PT Short Term Goal 4 - Progress: Met PT Long Term Goals Time to Complete Long Term Goals: 4 weeks PT Long Term Goal 1: Pt will be educated in lifting program to decrease risk of secondary injuries.  PT Long Term Goal 1 - Progress: Progressing toward goal PT Long Term Goal 2: Pt will improve her FOTO score to status greater than 64% and Limitation less than 36% for improved percieved functional ability.  PT Long Term Goal 2 - Progress: Other (comment) (not tested) Long Term Goal 3: Pt will  improve her BLE and core strength to [facility] in order to tolerate ambulating outdoors for greater than 30 minutes with decreased pain to low back and Rt knee to return to lesiure activities.  Long Term Goal 3 Progress: Progressing toward goal  Problem List Patient Active Problem List   Diagnosis Date Noted  . Low  back pain 02/17/2013  . Right knee pain 02/17/2013  . 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 IUD complication 06/24/2011  . Grave's disease 06/24/2011  . Osteopenia 06/24/2011    PT - End of Session Activity Tolerance: Patient tolerated treatment well General Behavior During Therapy: WFL for tasks assessed/performed PT Plan of Care PT Home Exercise Plan: given HEP for LE strengthening   Amy Kae Heller, PTA/CLT; Annett Fabian, MPT, ATC 03/10/2013, 10:54 AM

## 2013-03-12 ENCOUNTER — Ambulatory Visit (HOSPITAL_COMMUNITY): Payer: BC Managed Care – PPO | Admitting: Physical Therapy

## 2013-03-13 ENCOUNTER — Ambulatory Visit (HOSPITAL_COMMUNITY)
Admission: RE | Admit: 2013-03-13 | Discharge: 2013-03-13 | Disposition: A | Discharge status: Home / Self Care | Payer: BC Managed Care – PPO | Source: Ambulatory Visit | Attending: Internal Medicine | Admitting: Internal Medicine

## 2013-03-13 NOTE — Progress Notes (Addendum)
Physical Therapy Treatment Patient Details  Name: Sherry Reed MRN: [id number] Date of Birth: [birth year]  Today's Date: 03/13/2013 Time: 4098-1191 PT Time Calculation (min): 52 min Visit#: 7 of 16  Re-eval: 03/07/13 Charges:  therex 34' [phone], [place] 8' 1015-1025    Subjective:  Pt states she is feeling better.  States MD was pleased and wants her to continue therapy.  Currently with 3/10 Rt knee pain and no lumbar pain.    Exercise/Treatments Standing Heel Raises: Limitations Heel Raises Limitations: heel toe walking 1RT each Forward Lunge: 10 reps;Limitations Forward Lunge Limitations: Rt LE lead Side Lunge: 10 reps;Limitations Side Lunge Limitations: BLE Row: Both;15 reps;Theraband Theraband Level (Row): Level 3 (Green) Shoulder Extension: Both;15 reps;Theraband Theraband Level (Shoulder Extension): Level 3 (Green) Shoulder ADduction: Both;15 reps;Theraband Theraband Level (Shoulder Adduction): Level 3 (Green) Other Standing Lumbar Exercises: lateral step up 4" Rt LE 10 reps Other Standing Lumbar Exercises: wallslides with ball between knees to activate VMO 10X5" Seated Sit to Stand: 10 reps Other Seated Lumbar Exercises: NuStep 8' level 3 hills #3 Supine Straight Leg Raise: 10 reps;5 seconds;Limitations Straight Leg Raises Limitations: ER Bil LE with 2# Other Supine Lumbar Exercises: SAQ with ER 2# Bil LE    Modalities Modalities: Ultrasound Ultrasound Ultrasound Location: Rt lateral knee Ultrasound Parameters: 8 minutes, 3 MHz at 0.8 w/cm2 continuously Ultrasound Goals: Pain  Physical Therapy Assessment and Plan PT Assessment and Plan PT Assessment: Added new activities to target VMO strengthening.  Pt without c/o pain or difficulty with new therex.  Continued with [place] to help decrease pain in lateral knee.  PT Plan: Add reciprocal stairs and continue to strengthen VMO.  May try elliptical next visit.   Progress towards proper lifting techniques.       Problem List Patient Active Problem List   Diagnosis Date Noted  . Low back pain 02/17/2013  . Right knee pain 02/17/2013  . 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 IUD complication 06/24/2011  . Grave's disease 06/24/2011  . Osteopenia 06/24/2011       Sherry Reed, PTA/CLT 03/13/2013, 10:20 AM

## 2013-03-17 ENCOUNTER — Inpatient Hospital Stay (HOSPITAL_COMMUNITY): Admission: RE | Admit: 2013-03-17 | Payer: BC Managed Care – PPO | Source: Ambulatory Visit

## 2013-03-24 ENCOUNTER — Ambulatory Visit (HOSPITAL_COMMUNITY)
Admission: RE | Admit: 2013-03-24 | Discharge: 2013-03-24 | Disposition: A | Discharge status: Home / Self Care | Payer: BC Managed Care – PPO | Source: Ambulatory Visit | Attending: Orthopaedic Surgery | Admitting: Orthopaedic Surgery

## 2013-03-24 DIAGNOSIS — M25569 Pain in unspecified knee: Secondary | ICD-10-CM | POA: Insufficient documentation

## 2013-03-24 DIAGNOSIS — M25561 Pain in right knee: Secondary | ICD-10-CM

## 2013-03-24 DIAGNOSIS — M545 Low back pain, unspecified: Secondary | ICD-10-CM | POA: Insufficient documentation

## 2013-03-24 DIAGNOSIS — M2241 Chondromalacia patellae, right knee: Secondary | ICD-10-CM

## 2013-03-24 DIAGNOSIS — IMO0001 Reserved for inherently not codable concepts without codable children: Secondary | ICD-10-CM | POA: Insufficient documentation

## 2013-03-24 DIAGNOSIS — R269 Unspecified abnormalities of gait and mobility: Secondary | ICD-10-CM | POA: Insufficient documentation

## 2013-03-24 NOTE — Progress Notes (Signed)
Physical Therapy Treatment Patient Details  Name: Sherry Reed MRN: [id number] Date of Birth: 1950-09-13  Today's Date: 03/24/2013 Time: 1302-1344 PT Time Calculation (min): 42 min Charge: TE 2725-3664  Visit#: 8 of 16  Re-eval: 04/07/13 Assessment Diagnosis: Low back pain Next MD Visit: Dr. Magnus Ivan -   Subjective: Symptoms/Limitations Symptoms: Pt reported noted pakn Lt knee, stated feels like Lt knee has gaven out on her last week.  No current back or Rt knee pain today. Pain Assessment Currently in Pain?: No/denies  Objective:   Exercise/Treatments Aerobic Elliptical: 5' L1 Standing Heel Raises: Limitations Heel Raises Limitations: heel toe walking 1RT each Forward Lunge: 10 reps;Limitations Forward Lunge Limitations: Bil LE  Side Lunge: 10 reps;Limitations Side Lunge Limitations: BLE Other Standing Lumbar Exercises: lateral step up 4" Bil LE 10 reps;Reciprocal pattern 2Fl w/ 1 handrail Other Standing Lumbar Exercises: wallslides with ball between knees to activate VMO 10X5" Supine Straight Leg Raise: 10 reps;5 seconds;Limitations Straight Leg Raises Limitations: ER Bil LE with 3# Other Supine Lumbar Exercises: SAQ with ER 3# Bil LE x 10    Physical Therapy Assessment and Plan PT Assessment and Plan Clinical Impression Statement: Continued progressing LE strengthening with main focus on VMO.  Progressed to stairwell reciprocal pattern for quad strengtheing with 1 handrail, noted weak eccentric control while descending stairs.  Began elliptical to improve activity tolerance, strength and gait mechanics.  No report of increased pain through session.   PT Plan: F/u with session, [place] for knee pain if needed.  Continue progressing LE strengthening, especially VMO.  Progress towards proper lifting techniques.     Goals Home Exercise Program Pt/caregiver will Perform Home Exercise Program: Independently PT Short Term Goals Time to Complete Short Term Goals: 2  weeks PT Short Term Goal 1: Pt will report low back pain less than 2/10 for 75% of her day and Rt knee pain less than 4/10 with descending stairs.  PT Short Term Goal 2: Pt will be educated on patellar mobilizations and self massage to decrease fascial restrictions to lateral knee.  PT Short Term Goal 3: Pt will improve her core and LE flexibility in order to present with normalized lumbar AROM to progress towards proper lifting techniques.  PT Short Term Goal 4: Pt will be educated in proper posture and will begin postural strengthening to progress towards LTG.  PT Long Term Goals Time to Complete Long Term Goals: 4 weeks PT Long Term Goal 1: Pt will be educated in lifting program to decrease risk of secondary injuries.  PT Long Term Goal 2: Pt will improve her FOTO score to status greater than 64% and Limitation less than 36% for improved percieved functional ability.  Long Term Goal 3: Pt will improve her BLE and core strength to [facility] in order to tolerate ambulating outdoors for greater than 30 minutes with decreased pain to low back and Rt knee to return to lesiure activities.  Long Term Goal 3 Progress: Progressing toward goal  Problem List Patient Active Problem List   Diagnosis Date Noted  . Low back pain 02/17/2013  . Right knee pain 02/17/2013  . 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 IUD complication 06/24/2011  . Grave's disease 06/24/2011  . Osteopenia 06/24/2011    PT - End of Session Activity Tolerance: Patient tolerated treatment well General Behavior During Therapy: [facility] for tasks assessed/performed  GP    Juel Burrow 03/24/2013, 1:45 PM

## 2013-06-19 ENCOUNTER — Other Ambulatory Visit: Payer: Self-pay | Admitting: Obstetrics and Gynecology

## 2013-06-19 DIAGNOSIS — Z139 Encounter for screening, unspecified: Secondary | ICD-10-CM

## 2013-07-10 ENCOUNTER — Ambulatory Visit (HOSPITAL_COMMUNITY): Payer: BC Managed Care – PPO

## 2013-08-14 ENCOUNTER — Encounter (HOSPITAL_COMMUNITY): Payer: Self-pay | Admitting: Emergency Medicine

## 2013-08-14 ENCOUNTER — Emergency Department (HOSPITAL_COMMUNITY): Payer: BC Managed Care – PPO

## 2013-08-14 ENCOUNTER — Emergency Department (HOSPITAL_COMMUNITY)
Admission: EM | Admit: 2013-08-14 | Discharge: 2013-08-14 | Disposition: A | Discharge status: Home / Self Care | Payer: BC Managed Care – PPO | Attending: Emergency Medicine | Admitting: Emergency Medicine

## 2013-08-14 DIAGNOSIS — R519 Headache, unspecified: Secondary | ICD-10-CM

## 2013-08-14 DIAGNOSIS — E079 Disorder of thyroid, unspecified: Secondary | ICD-10-CM | POA: Insufficient documentation

## 2013-08-14 DIAGNOSIS — R42 Dizziness and giddiness: Secondary | ICD-10-CM | POA: Insufficient documentation

## 2013-08-14 DIAGNOSIS — IMO0002 Reserved for concepts with insufficient information to code with codable children: Secondary | ICD-10-CM | POA: Insufficient documentation

## 2013-08-14 DIAGNOSIS — Z7983 Long term (current) use of bisphosphonates: Secondary | ICD-10-CM | POA: Insufficient documentation

## 2013-08-14 DIAGNOSIS — R51 Headache: Secondary | ICD-10-CM | POA: Insufficient documentation

## 2013-08-14 DIAGNOSIS — Z79899 Other long term (current) drug therapy: Secondary | ICD-10-CM | POA: Insufficient documentation

## 2013-08-14 HISTORY — DX: Disorder of thyroid, unspecified: E07.9

## 2013-08-14 NOTE — ED Provider Notes (Signed)
CSN: [id number]     Arrival date & time 08/14/13  1000 History  This chart was scribed for Hoy Morn, MD by Allena Earing, ED Scribe. This patient was seen in room APA14/APA14 and the patient's care was started at 12:40 PM .      Chief Complaint  Patient presents with  . Headache     The history is provided by the patient. No language interpreter was used.     HPI Comments: Sherry Reed is a 63 y.o. female who presents to the Emergency Department complaining of HA, a jolt of pain on her right side of her head. This event happened Tuesday at 5 PM, she went to urgent care yesterday after she thought about it and did not want to be one of those people in "denial". Pt reports no HA currently. Pt reports it was a  "frightening event yesterday", her PCP told her to ED. Pt reports a weird associated feeling 2-3 months ago, behind her left eye. The feeling was like something growing, getting larger. Pt reports associated dizziness. Pt reports right side dental pain resulting from dental work on a cavity.   Pt reports that she currently feels great.  Past Medical History  Diagnosis Date  . Thyroid disease    Past Surgical History  Procedure Laterality Date  . Throat surgery    . Tonsillectomy     No family history on file. History  Substance Use Topics  . Smoking status: Never Smoker   . Smokeless tobacco: Not on file  . Alcohol Use: Yes     Comment: rare   OB History   Grav Para Term Preterm Abortions TAB SAB Ect Mult Living                 Review of Systems  Neurological: Positive for dizziness, light-headedness and headaches.   A complete 10 system review of systems was obtained and all systems are negative except as noted in the HPI and PMH.     Allergies  Review of patient's allergies indicates no known allergies.  Home Medications   Current Outpatient Rx  Name  Route  Sig  Dispense  Refill  . calcium gluconate 500 MG tablet   Oral   Take 1 tablet by  mouth 3 (three) times daily.         Marland Kitchen CRANBERRY PO   Oral   Take 1 capsule by mouth daily.         . fluticasone (FLONASE) 50 MCG/ACT nasal spray   Each Nare   Place 2 sprays into both nostrils daily.         . Glucosamine-Chondroit-Vit C-Mn (GLUCOSAMINE 1500 COMPLEX PO)   Oral   Take 1 tablet by mouth daily.         Marland Kitchen ibandronate (BONIVA) 150 MG tablet   Oral   Take 1 tablet (150 mg total) by mouth every 30 (thirty) days. Take in the morning with a full glass of water, on an empty stomach, and do not take anything else by mouth or lie down for the next 30 min.   1 tablet   10   . levothyroxine (SYNTHROID, LEVOTHROID) 125 MCG tablet   Oral   Take 1 tablet by mouth daily.         . Multiple Vitamin (MULTIVITAMIN WITH MINERALS) TABS tablet   Oral   Take 1 tablet by mouth daily.         . Omega-3 Fatty Acids (  FISH OIL) 1000 MG CAPS   Oral   Take 1 capsule by mouth daily.          BP 149/77  Pulse 88  Temp(Src) 98.5 F (36.9 C)  Resp 18  Ht 5\' 7"  (1.702 m)  Wt 175 lb (79.379 kg)  BMI 27.40 kg/m2  SpO2 98% Physical Exam  Nursing note and vitals reviewed. Constitutional: She is oriented to person, place, and time. She appears well-developed and well-nourished.  HENT:  Head: Normocephalic and atraumatic.  Eyes: EOM are normal. Pupils are equal, round, and reactive to light.  Neck: Normal range of motion.  Cardiovascular: Normal rate, regular rhythm and normal heart sounds.   No carotid bruits bilaterally   Pulmonary/Chest: Effort normal.  Abdominal: Soft. She exhibits no distension.  Musculoskeletal: Normal range of motion.  Neurological: She is alert and oriented to person, place, and time.  5/5 strength in major muscle groups of  bilateral upper and lower extremities. Speech normal. No facial asymetry.   Psychiatric: She has a normal mood and affect. Her behavior is normal. Judgment and thought content normal.    ED Course  Procedures (including  critical care time)  DIAGNOSTIC STUDIES: Oxygen Saturation is 98% on RA, RA by my interpretation.    COORDINATION OF CARE:  12:45 PM-Discussed treatment plan which includes  CT scan of her head with pt at bedside and pt agreed to plan.      Labs Review Labs Reviewed - No data to display Imaging Review No results found.   EKG Interpretation None      MDM   Final diagnoses:  None  I personally performed the services described in this documentation, which was scribed in my presence. The recorded information has been reviewed and is accurate.    Low suspicion for subarachnoid hemorrhage.  CT head normal.  Patient is asymptomatic at this time.  Doubt TIA.  Outpatient followup with PCP.  No indication for additional testing or imaging in the emergency department tonight.  Normal neuro exam.    Hoy Morn, MD 08/14/13 [phone]

## 2013-08-14 NOTE — ED Notes (Signed)
Pt c/o sudden onset right side ha on 3/31 lasting a few seconds. Pt seen at urgent care yesterday. Pt called pcp this am and was told to come to ED. Denies visual/auditory disturbances, weakness, speech deficits. deneis pain at present. Nad noted.

## 2013-08-14 NOTE — ED Notes (Signed)
Pt denies pain today. Pt states a few days ago she had a sharp pain to the right of her head, lasting ~ 5 seconds and states that it was enough to alarm her. Pt denies ever having any neuro deficits with this pain.

## 2013-08-19 ENCOUNTER — Ambulatory Visit (HOSPITAL_COMMUNITY)
Admission: RE | Admit: 2013-08-19 | Discharge: 2013-08-19 | Disposition: A | Discharge status: Home / Self Care | Payer: BC Managed Care – PPO | Source: Ambulatory Visit | Attending: Obstetrics and Gynecology | Admitting: Obstetrics and Gynecology

## 2013-08-19 DIAGNOSIS — Z139 Encounter for screening, unspecified: Secondary | ICD-10-CM

## 2013-08-19 DIAGNOSIS — Z1231 Encounter for screening mammogram for malignant neoplasm of breast: Secondary | ICD-10-CM | POA: Insufficient documentation

## 2013-12-28 ENCOUNTER — Encounter (HOSPITAL_BASED_OUTPATIENT_CLINIC_OR_DEPARTMENT_OTHER): Payer: BC Managed Care – PPO

## 2014-02-17 ENCOUNTER — Ambulatory Visit (HOSPITAL_BASED_OUTPATIENT_CLINIC_OR_DEPARTMENT_OTHER): Payer: Medicare Other | Attending: Internal Medicine | Admitting: Radiology

## 2014-02-17 VITALS — Ht 66.0 in | Wt 175.0 lb

## 2014-02-17 DIAGNOSIS — G4730 Sleep apnea, unspecified: Secondary | ICD-10-CM | POA: Insufficient documentation

## 2014-02-17 DIAGNOSIS — R0683 Snoring: Secondary | ICD-10-CM | POA: Insufficient documentation

## 2014-02-17 DIAGNOSIS — G4733 Obstructive sleep apnea (adult) (pediatric): Secondary | ICD-10-CM

## 2014-02-25 DIAGNOSIS — G4733 Obstructive sleep apnea (adult) (pediatric): Secondary | ICD-10-CM

## 2014-02-25 NOTE — Sleep Study (Signed)
   NAME: Sherry Reed DATE OF BIRTH:  1950-12-06 MEDICAL RECORD NUMBER [id number]  LOCATION: [place]  PHYSICIAN: Solaris Kram D  DATE OF STUDY: 02/17/2014  SLEEP STUDY TYPE: Nocturnal Polysomnogram               REFERRING PHYSICIAN: Ascencion Dike, MD  INDICATION FOR STUDY: Hypersomnia with sleep apnea  EPWORTH SLEEPINESS SCORE:   6/24 HEIGHT: 5\' 6"  (167.6 cm)  WEIGHT: 175 lb (79.379 kg)    Body mass index is 28.26 kg/(m^2).  NECK SIZE: 13 in.  MEDICATIONS: Charted for review  SLEEP ARCHITECTURE: Total sleep time 280 minutes with sleep efficiency 71.1%. Stage I was 3.2%, stage II 75.4%, stage III absent, REM 21.4% of total sleep time. Sleep latency 56.5 minutes, REM latency 91.5 minutes, awake after sleep onset 57 minutes, arousal index 11.1, bedtime medication: None  RESPIRATORY DATA: Apnea hypopneas index (AHI) 0.6 per hour. 3 total events scored, all as hypopneas while sleeping nonsupine. REM AHI 3.0 per hour. CPAP titration was not done.  OXYGEN DATA: Moderate snoring with oxygen desaturation to a nadir of 85% and mean saturation 93.3% on room air.  CARDIAC DATA: Normal sinus rhythm  MOVEMENT/PARASOMNIA: No significant movements, bathroom x1  IMPRESSION/ RECOMMENDATION:   1) Within normal limits. Occasional respiratory event with sleep disturbance, AHI 0.6 per hour reflecting a total of 3 hypopneas while nonsupine, in REM. The normal range for adults is an AHI from 0-5 events per hour). There was moderate snoring with oxygen desaturation to a nadir of 85% and mean saturation 93.3% on room air.   Deneise Lever Diplomate, American Board of Sleep Medicine  ELECTRONICALLY SIGNED ON:  02/25/2014, 1:41 PM [place] PH: [phone]   FX: [phone] [place]

## 2014-09-16 ENCOUNTER — Other Ambulatory Visit (HOSPITAL_COMMUNITY): Payer: Self-pay | Admitting: Obstetrics and Gynecology

## 2014-09-16 DIAGNOSIS — Z1231 Encounter for screening mammogram for malignant neoplasm of breast: Secondary | ICD-10-CM

## 2014-10-08 ENCOUNTER — Ambulatory Visit (HOSPITAL_COMMUNITY): Payer: BC Managed Care – PPO

## 2014-10-26 ENCOUNTER — Ambulatory Visit (HOSPITAL_COMMUNITY): Payer: BC Managed Care – PPO

## 2015-01-11 ENCOUNTER — Encounter: Payer: Self-pay | Admitting: Internal Medicine

## 2015-08-11 ENCOUNTER — Ambulatory Visit (HOSPITAL_COMMUNITY)
Admission: RE | Admit: 2015-08-11 | Discharge: 2015-08-11 | Disposition: A | Discharge status: Home / Self Care | Payer: Medicare Other | Source: Ambulatory Visit | Attending: Internal Medicine | Admitting: Internal Medicine

## 2015-08-11 ENCOUNTER — Other Ambulatory Visit (HOSPITAL_COMMUNITY): Payer: Self-pay | Admitting: Internal Medicine

## 2015-08-11 DIAGNOSIS — R05 Cough: Secondary | ICD-10-CM | POA: Insufficient documentation

## 2015-08-11 DIAGNOSIS — R059 Cough, unspecified: Secondary | ICD-10-CM

## 2015-08-11 DIAGNOSIS — R918 Other nonspecific abnormal finding of lung field: Secondary | ICD-10-CM | POA: Insufficient documentation

## 2015-09-21 ENCOUNTER — Ambulatory Visit (HOSPITAL_COMMUNITY)
Admission: RE | Admit: 2015-09-21 | Discharge: 2015-09-21 | Disposition: A | Discharge status: Home / Self Care | Payer: Medicare Other | Source: Ambulatory Visit | Attending: Internal Medicine | Admitting: Internal Medicine

## 2015-09-21 ENCOUNTER — Other Ambulatory Visit (HOSPITAL_COMMUNITY): Payer: Self-pay | Admitting: Internal Medicine

## 2015-09-21 DIAGNOSIS — J189 Pneumonia, unspecified organism: Secondary | ICD-10-CM

## 2015-09-21 DIAGNOSIS — R0602 Shortness of breath: Secondary | ICD-10-CM | POA: Diagnosis present

## 2015-09-21 DIAGNOSIS — R079 Chest pain, unspecified: Secondary | ICD-10-CM | POA: Insufficient documentation

## 2015-09-21 DIAGNOSIS — J449 Chronic obstructive pulmonary disease, unspecified: Secondary | ICD-10-CM | POA: Diagnosis not present

## 2015-11-01 ENCOUNTER — Ambulatory Visit (INDEPENDENT_AMBULATORY_CARE_PROVIDER_SITE_OTHER): Payer: Medicare Other | Admitting: Otolaryngology

## 2015-11-01 DIAGNOSIS — H6983 Other specified disorders of Eustachian tube, bilateral: Secondary | ICD-10-CM

## 2015-11-01 DIAGNOSIS — H6121 Impacted cerumen, right ear: Secondary | ICD-10-CM | POA: Diagnosis not present

## 2015-12-20 ENCOUNTER — Ambulatory Visit (INDEPENDENT_AMBULATORY_CARE_PROVIDER_SITE_OTHER): Payer: Medicare Other | Admitting: Otolaryngology

## 2015-12-20 DIAGNOSIS — H6983 Other specified disorders of Eustachian tube, bilateral: Secondary | ICD-10-CM | POA: Diagnosis not present

## 2016-08-01 ENCOUNTER — Ambulatory Visit (INDEPENDENT_AMBULATORY_CARE_PROVIDER_SITE_OTHER): Payer: Medicare Other | Admitting: Physician Assistant

## 2016-08-01 ENCOUNTER — Ambulatory Visit (INDEPENDENT_AMBULATORY_CARE_PROVIDER_SITE_OTHER): Payer: Medicare Other

## 2016-08-01 DIAGNOSIS — G8929 Other chronic pain: Secondary | ICD-10-CM

## 2016-08-01 DIAGNOSIS — M7061 Trochanteric bursitis, right hip: Secondary | ICD-10-CM | POA: Diagnosis not present

## 2016-08-01 DIAGNOSIS — M2242 Chondromalacia patellae, left knee: Secondary | ICD-10-CM

## 2016-08-01 DIAGNOSIS — M2241 Chondromalacia patellae, right knee: Secondary | ICD-10-CM | POA: Diagnosis not present

## 2016-08-01 DIAGNOSIS — M25561 Pain in right knee: Secondary | ICD-10-CM

## 2016-08-01 MED ORDER — MELOXICAM 7.5 MG PO TABS: 7.5000 mg | ORAL_TABLET | Freq: Every day | ORAL | 6 refills | Status: DC

## 2016-08-01 NOTE — Progress Notes (Signed)
Office Visit Note   Patient: Sherry Reed           Date of Birth: April 28, 1951           MRN: [id number] Visit Date: 08/01/2016              Requested by: Celene Squibb, MD [address] [place], [place] [address] PCP: Wende Neighbors, MD   Assessment & Plan: Visit Diagnoses:  1. Chondromalacia of both patellae   2. Chronic pain of right knee   3. Trochanteric bursitis of right hip     Plan: We will send her to physical therapy for IT band stretching, quad strengthening, home exercise program and modalities. Place back on mobile 7.5 mg 1 by mouth daily. I did suggest that she start taking tumor. Quad strengthening exercises are shown and had patient demonstrated these today. She'll follow-up with [place] on an as-needed basis if pain persists or becomes worse.  Follow-Up Instructions: Return if symptoms worsen or fail to improve.   Orders:  Orders Placed This Encounter  Procedures  . XR Knee 1-2 Views Left  . XR Knee 1-2 Views Right  . XR HIP UNILAT W OR W/O PELVIS 1V RIGHT   Meds ordered this encounter  Medications  . meloxicam (MOBIC) 7.5 MG tablet    Sig: Take 1 tablet (7.5 mg total) by mouth daily.    Dispense:  30 tablet    Refill:  6      Procedures: No procedures performed   Clinical Data: No additional findings.   Subjective: Biateral knee pain Right hip pain   HPI Sherry Reed 66 year old female well known to Dr. Trevor Mace service comes in today with bilateral knee pain and decreased strength in both legs for at least last year. She has small and off swelling in both knees. Feels the right knee is somewhat worse than left left. Sensation of knees may give way no catching locking or painful popping she does have popping in the knees and some grinding. Pains worse whenever she first begins to ambulate or if she has to kneel or squat. Also having pain over the right lateral hip. No radicular symptoms down the right leg. She's had no injury to either knee on  either hip. She does feel that the meloxicam in the past that helped. She takes glucosamine/chondroitin. Past medical  history is pertinent for her IBS, osteopenia, Graves' disease, low back pain and bilateral knee pain.  Review of Systems Please see history of present illness ,otherwise review of systems is negative.  Objective: Vital Signs: There were no vitals taken for this visit.  Physical Exam  Constitutional: She is oriented to person, place, and time. She appears well-developed and well-nourished. No distress.  Eyes: EOM are normal.  Cardiovascular: Intact distal pulses.   Pulmonary/Chest: Effort normal.  Neurological: She is alert and oriented to person, place, and time.  Skin: She is not diaphoretic.  Psychiatric: She has a normal mood and affect. Her behavior is normal.    Ortho Exam Bilateral hip she has excellent range of motion both hips without pain. Tenderness over the right greater than left trochanteric region. Bilateral knees full range of motion. Crepitus with passive range of motion both knees. No instability valgus varus stressing. No tenderness medial lateral joint line of either knee. No effusion abnormal warmth erythema of either knee. Phillips Odor test positive on the left negative on the right. Quad atrophy bilaterally Specialty Comments:  No specialty comments available.  Imaging: Xr Hip Unilat W Or W/o Pelvis 1v Right  Result Date: 08/01/2016 AP pelvis: Bilateral hips well located hip joints are well preserved. No acute fractures. Sclerotic activity over the trochanteric region bilateral hips right greater than left.  Xr Knee 1-2 Views Left  Result Date: 08/01/2016 Left knee 2 views shows well-preserved knee with mild patellofemoral changes. No acute fractures no bony mallet is otherwise  Xr Knee 1-2 Views Right  Result Date: 08/01/2016 Right knee 2 views: No acute fracture. Medial lateral joint lines are well preserved. Mild patellofemoral changes. No  other bony Mallet    PMFS History: Patient Active Problem List   Diagnosis Date Noted  . Low back pain 02/17/2013  . Right knee pain 02/17/2013  . 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 IUD complication ([place]) 68/34/1962  . Grave's disease 06/24/2011  . Osteopenia 06/24/2011   Past Medical History:  Diagnosis Date  . Hypothyroidism    On thyroid replacement  . Observed sleep apnea    Status post UVPP  . SOB (shortness of breath)   . Thyroid disease     Family History  Problem Relation Age of Onset  . Diabetes Father     Past Surgical History:  Procedure Laterality Date  . THROAT SURGERY    . TONSILLECTOMY     Social History   Occupational History  . Not on file.   Social History Main Topics  . Smoking status: Never Smoker  . Smokeless tobacco: Not on file  . Alcohol use Yes     Comment: rare  . Drug use: No  . Sexual activity: Not on file

## 2017-02-08 ENCOUNTER — Other Ambulatory Visit (INDEPENDENT_AMBULATORY_CARE_PROVIDER_SITE_OTHER): Payer: Self-pay

## 2017-02-08 MED ORDER — MELOXICAM 7.5 MG PO TABS: 7.5000 mg | ORAL_TABLET | Freq: Every day | ORAL | 1 refills | Status: DC

## 2017-06-25 ENCOUNTER — Ambulatory Visit (INDEPENDENT_AMBULATORY_CARE_PROVIDER_SITE_OTHER): Payer: Medicare Other | Admitting: Otolaryngology

## 2017-06-25 DIAGNOSIS — J310 Chronic rhinitis: Secondary | ICD-10-CM

## 2017-06-25 DIAGNOSIS — H903 Sensorineural hearing loss, bilateral: Secondary | ICD-10-CM | POA: Diagnosis not present

## 2017-06-25 DIAGNOSIS — H9313 Tinnitus, bilateral: Secondary | ICD-10-CM | POA: Diagnosis not present

## 2017-07-15 ENCOUNTER — Ambulatory Visit: Payer: Medicare Other | Attending: Otolaryngology | Admitting: Neurology

## 2017-07-15 DIAGNOSIS — I493 Ventricular premature depolarization: Secondary | ICD-10-CM | POA: Diagnosis not present

## 2017-07-15 DIAGNOSIS — Z791 Long term (current) use of non-steroidal anti-inflammatories (NSAID): Secondary | ICD-10-CM | POA: Insufficient documentation

## 2017-07-15 DIAGNOSIS — Z79890 Hormone replacement therapy (postmenopausal): Secondary | ICD-10-CM | POA: Diagnosis not present

## 2017-07-15 DIAGNOSIS — G4733 Obstructive sleep apnea (adult) (pediatric): Secondary | ICD-10-CM | POA: Diagnosis not present

## 2017-07-15 DIAGNOSIS — R0683 Snoring: Secondary | ICD-10-CM | POA: Diagnosis not present

## 2017-07-21 NOTE — Procedures (Signed)
[place] A. Merlene Laughter, MD     [email or web address]             NOCTURNAL POLYSOMNOGRAPHY   LOCATION: [facility]  Patient Name: Sherry Reed, Sherry Reed Date: 07/15/2017 Gender: Female D.O.B: Jan 01, 1951 Age (years): 7 Referring Provider: Leta Baptist Height (inches): 66 Interpreting Physician: Phillips Odor MD, ABSM Weight (lbs): 175 RPSGT: Rosebud Poles BMI: 28 MRN: [id number] Neck Size: 15.50 CLINICAL INFORMATION Sleep Study Type: NPSG     Indication for sleep study: OSA, Snoring     Epworth Sleepiness Score: 8     SLEEP STUDY TECHNIQUE As per the AASM Manual for the Scoring of Sleep and Associated Events v2.3 (April 2016) with a hypopnea requiring 4% desaturations.  The channels recorded and monitored were frontal, central and occipital EEG, electrooculogram (EOG), submentalis EMG (chin), nasal and oral airflow, thoracic and abdominal wall motion, anterior tibialis EMG, snore microphone, electrocardiogram, and pulse oximetry.  MEDICATIONS Medications self-administered by patient taken the night of the study : N/A  Current Outpatient Medications:  .  calcium gluconate 500 MG tablet, Take 1 tablet by mouth 3 (three) times daily., Disp: , Rfl:  .  CRANBERRY PO, Take 1 capsule by mouth daily., Disp: , Rfl:  .  fluticasone (FLONASE) 50 MCG/ACT nasal spray, Place 2 sprays into both nostrils daily., Disp: , Rfl:  .  Glucosamine-Chondroit-Vit C-Mn (GLUCOSAMINE 1500 COMPLEX PO), Take 1 tablet by mouth daily., Disp: , Rfl:  .  ibandronate (BONIVA) 150 MG tablet, Take 1 tablet (150 mg total) by mouth every 30 (thirty) days. Take in the morning with a full glass of water, on an empty stomach, and do not take anything else by mouth or lie down for the next 30 min., Disp: 1 tablet, Rfl: 10 .  levothyroxine (SYNTHROID, LEVOTHROID) 125 MCG tablet, Take 1 tablet by mouth daily., Disp: , Rfl:  .  meloxicam (MOBIC) 7.5 MG tablet, Take 1 tablet (7.5 mg total) by mouth  daily., Disp: 90 tablet, Rfl: 1 .  Multiple Vitamin (MULTIVITAMIN WITH MINERALS) TABS tablet, Take 1 tablet by mouth daily., Disp: , Rfl:  .  Omega-3 Fatty Acids (FISH OIL) 1000 MG CAPS, Take 1 capsule by mouth daily., Disp: , Rfl:      SLEEP ARCHITECTURE The study was initiated at 10:06:42 PM and ended at 5:07:13 AM.  Sleep onset time was 64.3 minutes and the sleep efficiency was 76.8%%. The total sleep time was 323.0 minutes.  Stage REM latency was 44.5 minutes.  The patient spent 2.0%% of the night in stage N1 sleep, 52.5%% in stage N2 sleep, 21.7%% in stage N3 and 23.84% in REM.  Alpha intrusion was absent.  Supine sleep was 15.63%.  RESPIRATORY PARAMETERS The overall apnea/hypopnea index (AHI) was 8.7 per hour. There were 2 total apneas, including 2 obstructive, 0 central and 0 mixed apneas. There were 45 hypopneas and 0 RERAs.  The AHI during Stage REM sleep was 22.6 per hour.  AHI while supine was 11.9 per hour.  The mean oxygen saturation was 93.1%. The minimum SpO2 during sleep was 85.0%.  moderate snoring was noted during this study.  CARDIAC DATA The 2 lead EKG demonstrated sinus rhythm. The mean heart rate was N/A beats per minute. Other EKG findings include: PVCs. LEG MOVEMENT DATA The total PLMS were 0 with a resulting PLMS index of 0.0. Associated arousal with leg movement index was 0.0.  IMPRESSIONS Mild obstructive sleep apnea worse during REM sleep is recorded during this study. The severity  does not require positive pressure treatment.    Delano Metz, MD Diplomate, American Board of Sleep Medicine. ELECTRONICALLY SIGNED ON:  07/21/2017, 9:32 AM [facility] PH: [phone]   FX: [phone] [place]

## 2017-09-07 ENCOUNTER — Other Ambulatory Visit (INDEPENDENT_AMBULATORY_CARE_PROVIDER_SITE_OTHER): Payer: Self-pay | Admitting: Orthopaedic Surgery

## 2017-09-16 ENCOUNTER — Ambulatory Visit (HOSPITAL_COMMUNITY)
Admit: 2017-09-16 | Discharge: 2017-09-16 | Disposition: A | Discharge status: Home / Self Care | Payer: Medicare Other | Attending: Internal Medicine | Admitting: Internal Medicine

## 2017-09-16 ENCOUNTER — Emergency Department (HOSPITAL_COMMUNITY)
Admission: EM | Admit: 2017-09-16 | Discharge: 2017-09-16 | Disposition: A | Discharge status: Home / Self Care | Payer: Medicare Other

## 2017-09-16 DIAGNOSIS — R0602 Shortness of breath: Secondary | ICD-10-CM | POA: Insufficient documentation

## 2017-09-16 DIAGNOSIS — R918 Other nonspecific abnormal finding of lung field: Secondary | ICD-10-CM | POA: Diagnosis not present

## 2017-09-16 DIAGNOSIS — R0789 Other chest pain: Secondary | ICD-10-CM | POA: Diagnosis not present

## 2018-02-04 ENCOUNTER — Ambulatory Visit (INDEPENDENT_AMBULATORY_CARE_PROVIDER_SITE_OTHER): Payer: Medicare Other | Admitting: Otolaryngology

## 2018-03-04 ENCOUNTER — Ambulatory Visit (INDEPENDENT_AMBULATORY_CARE_PROVIDER_SITE_OTHER): Payer: Medicare Other | Admitting: Otolaryngology

## 2018-03-04 DIAGNOSIS — J343 Hypertrophy of nasal turbinates: Secondary | ICD-10-CM | POA: Diagnosis not present

## 2018-03-04 DIAGNOSIS — J310 Chronic rhinitis: Secondary | ICD-10-CM

## 2018-03-07 ENCOUNTER — Other Ambulatory Visit (INDEPENDENT_AMBULATORY_CARE_PROVIDER_SITE_OTHER): Payer: Self-pay | Admitting: Orthopaedic Surgery

## 2018-03-28 ENCOUNTER — Other Ambulatory Visit (HOSPITAL_COMMUNITY): Payer: Self-pay | Admitting: Internal Medicine

## 2018-03-28 DIAGNOSIS — R1031 Right lower quadrant pain: Secondary | ICD-10-CM

## 2018-04-19 ENCOUNTER — Ambulatory Visit (HOSPITAL_COMMUNITY)
Admission: RE | Admit: 2018-04-19 | Discharge: 2018-04-19 | Disposition: A | Discharge status: Home / Self Care | Payer: Medicare Other | Source: Ambulatory Visit | Attending: Internal Medicine | Admitting: Internal Medicine

## 2018-04-19 ENCOUNTER — Other Ambulatory Visit (HOSPITAL_COMMUNITY): Payer: Self-pay | Admitting: Internal Medicine

## 2018-04-19 ENCOUNTER — Encounter (HOSPITAL_COMMUNITY): Payer: Self-pay

## 2018-04-19 DIAGNOSIS — R1031 Right lower quadrant pain: Secondary | ICD-10-CM | POA: Insufficient documentation

## 2018-04-19 MED ORDER — IOPAMIDOL (ISOVUE-300) INJECTION 61%: 100.0000 mL | Freq: Once | INTRAVENOUS | Status: DC | PRN

## 2018-05-30 DIAGNOSIS — K580 Irritable bowel syndrome with diarrhea: Secondary | ICD-10-CM | POA: Insufficient documentation

## 2018-06-25 ENCOUNTER — Encounter: Payer: Self-pay | Admitting: Dietician

## 2018-06-25 ENCOUNTER — Encounter: Payer: Medicare Other | Attending: Obstetrics and Gynecology | Admitting: Dietician

## 2018-06-25 DIAGNOSIS — K582 Mixed irritable bowel syndrome: Secondary | ICD-10-CM | POA: Diagnosis not present

## 2018-06-25 NOTE — Patient Instructions (Addendum)
   For 4 weeks, try to follow the low FODMAP diet. Take a few days to get your pantry and fridge stocked and prepared. Try these low-FODMAP brands:   Schar brand  Larey Seat brand  IBS Network website   [place] website (a Firefighter with good resources)  [place] track of the foods you eat and symptoms you have. We will check back in 4 or 5 weeks to see how symptoms are.

## 2018-06-25 NOTE — Progress Notes (Signed)
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 Pt is very kind, personable, and was engaged during today's appointment. Pt states she rarely drinks alcohol and mentioned going out to eat or having meals with friends often.   24-Hr Dietary Recall First Meal: cottage cheese + pineapple  Snack: nuts Second Meal: BLT + rye bread Snack: none stated  Third Meal: spaghetti (or chicken pie)  Snack: none stated Beverages: water, coffee, decaf coffee, almond milk   Food & Nutrition Related Hx Dietary Hx: Pt states she prefers not to eat red meat. Pt states she likes spaghetti, BLTs, chicken pie, and "normal" foods, despite the symptoms they cause. Pt states she drinks coffee daily to help her use the restroom because otherwise she is constipated. Pt states she avoids drinking fluids if she is planning to ride in the car with others because she often requires frequent restroom breaks. Pt states she typically avoids eating (not only trigger foods, but any food at all) when eating out with friends or at a social gathering dt the symptoms that usually occur. Pt states she loves rye bread and typically eats light/small meals. Pt states she usually doesn't consume much dairy (especially milk) but her husband likes a lot of cheese. Pt states she and her husband are starting to have their lunch as their "larger" meal of the day and have dinner be smaller/lighter.  Trigger foods: sugar-free  gum, caffeine, sodas Estimated Daily Fluid Intake: 80 oz water Supplements: vitamin D3, C, calcium, omega-3  GI / Other Notable Symptoms: diarrhea, constipation, abdominal pain, gas  Estimated Energy Needs Calories: 1800 Carbohydrate: 200g Protein: 135g Fat: 50g   NUTRITION DIAGNOSIS  Altered gastrointestinal (GI) function ([place]-1.4) related to changes in GI tract motility and function (irritable bowel syndrome) as evidenced by diarrhea, constipation, abdominal pain, gas, and avoidance of specific foods/food groups due to GI symptoms following ingestion of food.   NUTRITION INTERVENTION  Nutrition education (E-1) on the following topics:  . IBS (Irritable Bowel Syndrome)  . Low FODMAP Diet   Handouts Provided Include   Low FODMAP Diet for Irritable Bowel Syndrome  FODMAP Checklist  Food & Symptom Diary   Learning Style & Readiness for Change Teaching method utilized: Visual & Auditory  Demonstrated degree of understanding via: Teach Back  Barriers to learning/adherence to lifestyle change: None Identified   MONITORING & EVALUATION Dietary intake, weekly physical activity, and IBS symptoms in 5 weeks.  RD's Notes for Next Visit  . Utilize the "GI- Digestive Health Slides" presentation to address the following:  o Stress management  o Physical activity  o Travel irregularity  . Low FODMAP diet stage 2 . Provide more low FODMAP resources (especially recipes and eating out tips)  . Address bladder concerns (per pt request)   Next Steps  Patient is to return to [facility] for follow up visit in 5 weeks and contact via phone or email with questions/concerns in the meantime.

## 2018-07-23 DIAGNOSIS — H903 Sensorineural hearing loss, bilateral: Secondary | ICD-10-CM | POA: Insufficient documentation

## 2018-07-30 ENCOUNTER — Other Ambulatory Visit (HOSPITAL_COMMUNITY): Payer: Self-pay | Admitting: Adult Health Nurse Practitioner

## 2018-07-30 ENCOUNTER — Other Ambulatory Visit: Payer: Self-pay

## 2018-07-30 ENCOUNTER — Ambulatory Visit (HOSPITAL_COMMUNITY)
Admission: RE | Admit: 2018-07-30 | Discharge: 2018-07-30 | Disposition: A | Discharge status: Home / Self Care | Payer: Medicare Other | Source: Ambulatory Visit | Attending: Adult Health Nurse Practitioner | Admitting: Adult Health Nurse Practitioner

## 2018-07-30 ENCOUNTER — Ambulatory Visit: Payer: Medicare Other | Admitting: Dietician

## 2018-07-30 DIAGNOSIS — R6889 Other general symptoms and signs: Secondary | ICD-10-CM

## 2018-07-30 DIAGNOSIS — R0602 Shortness of breath: Secondary | ICD-10-CM

## 2018-08-08 LAB — NOVEL CORONAVIRUS, NAA: SARS-CoV-2, NAA: NOT DETECTED

## 2018-08-12 ENCOUNTER — Telehealth (HOSPITAL_COMMUNITY): Payer: Self-pay | Admitting: Emergency Medicine

## 2018-08-12 NOTE — Telephone Encounter (Signed)
Your test for COVID-19 was negative.  Please continue good preventive care measures, including:  frequent hand-washing, avoid touching your face, cover coughs/sneezes, stay out of crowds and keep a 6 foot distance from others.  If you develop fever/cough/breathlessness, please stay home for 7 days and until you have had 3 consecutive days with cough/breathlessness improving and without fever (without taking a fever reducer). Go to the nearest hospital ED tent for assessment if fever/cough/breathlessness are severe or illness seems like a threat to life.  Patient contacted and made aware of all results, all questions answered.

## 2018-08-13 ENCOUNTER — Ambulatory Visit: Payer: Medicare Other | Admitting: Dietician

## 2019-01-29 ENCOUNTER — Other Ambulatory Visit (INDEPENDENT_AMBULATORY_CARE_PROVIDER_SITE_OTHER): Payer: Self-pay | Admitting: Orthopaedic Surgery

## 2019-03-19 ENCOUNTER — Other Ambulatory Visit: Payer: Self-pay | Admitting: Internal Medicine

## 2019-03-19 ENCOUNTER — Other Ambulatory Visit: Payer: Self-pay

## 2019-03-19 ENCOUNTER — Ambulatory Visit (HOSPITAL_COMMUNITY)
Admission: RE | Admit: 2019-03-19 | Discharge: 2019-03-19 | Disposition: A | Discharge status: Home / Self Care | Payer: Medicare Other | Source: Ambulatory Visit | Attending: Internal Medicine | Admitting: Internal Medicine

## 2019-03-19 ENCOUNTER — Other Ambulatory Visit (HOSPITAL_COMMUNITY): Payer: Self-pay | Admitting: Internal Medicine

## 2019-03-19 DIAGNOSIS — R1900 Intra-abdominal and pelvic swelling, mass and lump, unspecified site: Secondary | ICD-10-CM

## 2019-06-19 DIAGNOSIS — B351 Tinea unguium: Secondary | ICD-10-CM | POA: Diagnosis not present

## 2019-06-19 DIAGNOSIS — L814 Other melanin hyperpigmentation: Secondary | ICD-10-CM | POA: Diagnosis not present

## 2019-06-19 DIAGNOSIS — D229 Melanocytic nevi, unspecified: Secondary | ICD-10-CM | POA: Diagnosis not present

## 2019-06-19 DIAGNOSIS — D1801 Hemangioma of skin and subcutaneous tissue: Secondary | ICD-10-CM | POA: Diagnosis not present

## 2019-06-19 DIAGNOSIS — L819 Disorder of pigmentation, unspecified: Secondary | ICD-10-CM | POA: Diagnosis not present

## 2019-06-19 DIAGNOSIS — I8393 Asymptomatic varicose veins of bilateral lower extremities: Secondary | ICD-10-CM | POA: Diagnosis not present

## 2019-06-19 DIAGNOSIS — L821 Other seborrheic keratosis: Secondary | ICD-10-CM | POA: Diagnosis not present

## 2019-07-30 DIAGNOSIS — K582 Mixed irritable bowel syndrome: Secondary | ICD-10-CM | POA: Diagnosis not present

## 2019-07-30 DIAGNOSIS — S161XXA Strain of muscle, fascia and tendon at neck level, initial encounter: Secondary | ICD-10-CM | POA: Diagnosis not present

## 2019-07-30 DIAGNOSIS — R32 Unspecified urinary incontinence: Secondary | ICD-10-CM | POA: Diagnosis not present

## 2019-07-30 DIAGNOSIS — N819 Female genital prolapse, unspecified: Secondary | ICD-10-CM | POA: Diagnosis not present

## 2019-08-11 DIAGNOSIS — Z0000 Encounter for general adult medical examination without abnormal findings: Secondary | ICD-10-CM | POA: Diagnosis not present

## 2019-08-11 DIAGNOSIS — Z0189 Encounter for other specified special examinations: Secondary | ICD-10-CM | POA: Diagnosis not present

## 2019-08-11 DIAGNOSIS — E039 Hypothyroidism, unspecified: Secondary | ICD-10-CM | POA: Diagnosis not present

## 2019-08-11 DIAGNOSIS — B351 Tinea unguium: Secondary | ICD-10-CM | POA: Diagnosis not present

## 2019-08-11 DIAGNOSIS — J019 Acute sinusitis, unspecified: Secondary | ICD-10-CM | POA: Diagnosis not present

## 2019-08-11 DIAGNOSIS — E559 Vitamin D deficiency, unspecified: Secondary | ICD-10-CM | POA: Diagnosis not present

## 2019-08-11 DIAGNOSIS — J029 Acute pharyngitis, unspecified: Secondary | ICD-10-CM | POA: Diagnosis not present

## 2019-08-11 DIAGNOSIS — E782 Mixed hyperlipidemia: Secondary | ICD-10-CM | POA: Diagnosis not present

## 2019-08-11 DIAGNOSIS — G4710 Hypersomnia, unspecified: Secondary | ICD-10-CM | POA: Diagnosis not present

## 2019-08-18 ENCOUNTER — Other Ambulatory Visit (INDEPENDENT_AMBULATORY_CARE_PROVIDER_SITE_OTHER): Payer: Self-pay | Admitting: Orthopaedic Surgery

## 2019-08-19 DIAGNOSIS — R32 Unspecified urinary incontinence: Secondary | ICD-10-CM | POA: Diagnosis not present

## 2019-08-19 DIAGNOSIS — B351 Tinea unguium: Secondary | ICD-10-CM | POA: Diagnosis not present

## 2019-08-19 DIAGNOSIS — E039 Hypothyroidism, unspecified: Secondary | ICD-10-CM | POA: Diagnosis not present

## 2019-08-19 DIAGNOSIS — K581 Irritable bowel syndrome with constipation: Secondary | ICD-10-CM | POA: Diagnosis not present

## 2019-08-26 DIAGNOSIS — Z01419 Encounter for gynecological examination (general) (routine) without abnormal findings: Secondary | ICD-10-CM | POA: Diagnosis not present

## 2019-08-26 DIAGNOSIS — M8580 Other specified disorders of bone density and structure, unspecified site: Secondary | ICD-10-CM | POA: Diagnosis not present

## 2019-08-26 DIAGNOSIS — N819 Female genital prolapse, unspecified: Secondary | ICD-10-CM | POA: Diagnosis not present

## 2019-08-26 DIAGNOSIS — Z1231 Encounter for screening mammogram for malignant neoplasm of breast: Secondary | ICD-10-CM | POA: Diagnosis not present

## 2019-08-26 DIAGNOSIS — Z124 Encounter for screening for malignant neoplasm of cervix: Secondary | ICD-10-CM | POA: Diagnosis not present

## 2019-08-26 DIAGNOSIS — Z1211 Encounter for screening for malignant neoplasm of colon: Secondary | ICD-10-CM | POA: Diagnosis not present

## 2019-08-26 DIAGNOSIS — Z6828 Body mass index (BMI) 28.0-28.9, adult: Secondary | ICD-10-CM | POA: Diagnosis not present

## 2019-09-15 DIAGNOSIS — H6123 Impacted cerumen, bilateral: Secondary | ICD-10-CM | POA: Diagnosis not present

## 2019-09-15 DIAGNOSIS — H6983 Other specified disorders of Eustachian tube, bilateral: Secondary | ICD-10-CM | POA: Diagnosis not present

## 2019-09-15 DIAGNOSIS — J310 Chronic rhinitis: Secondary | ICD-10-CM | POA: Diagnosis not present

## 2019-09-16 DIAGNOSIS — G4710 Hypersomnia, unspecified: Secondary | ICD-10-CM | POA: Diagnosis not present

## 2019-09-16 DIAGNOSIS — K580 Irritable bowel syndrome with diarrhea: Secondary | ICD-10-CM | POA: Diagnosis not present

## 2019-09-16 DIAGNOSIS — E782 Mixed hyperlipidemia: Secondary | ICD-10-CM | POA: Diagnosis not present

## 2019-09-16 DIAGNOSIS — E039 Hypothyroidism, unspecified: Secondary | ICD-10-CM | POA: Diagnosis not present

## 2019-09-16 DIAGNOSIS — B351 Tinea unguium: Secondary | ICD-10-CM | POA: Diagnosis not present

## 2019-09-16 DIAGNOSIS — Z0189 Encounter for other specified special examinations: Secondary | ICD-10-CM | POA: Diagnosis not present

## 2019-09-16 DIAGNOSIS — J029 Acute pharyngitis, unspecified: Secondary | ICD-10-CM | POA: Diagnosis not present

## 2019-09-16 DIAGNOSIS — E559 Vitamin D deficiency, unspecified: Secondary | ICD-10-CM | POA: Diagnosis not present

## 2019-09-16 DIAGNOSIS — J019 Acute sinusitis, unspecified: Secondary | ICD-10-CM | POA: Diagnosis not present

## 2019-09-16 DIAGNOSIS — R32 Unspecified urinary incontinence: Secondary | ICD-10-CM | POA: Diagnosis not present

## 2019-09-16 DIAGNOSIS — Z0000 Encounter for general adult medical examination without abnormal findings: Secondary | ICD-10-CM | POA: Diagnosis not present

## 2020-02-06 ENCOUNTER — Ambulatory Visit
Admission: EM | Admit: 2020-02-06 | Discharge: 2020-02-06 | Disposition: A | Discharge status: Home / Self Care | Payer: Medicare PPO | Attending: Emergency Medicine | Admitting: Emergency Medicine

## 2020-02-06 ENCOUNTER — Encounter: Payer: Self-pay | Admitting: Emergency Medicine

## 2020-02-06 DIAGNOSIS — J029 Acute pharyngitis, unspecified: Secondary | ICD-10-CM | POA: Diagnosis not present

## 2020-02-06 DIAGNOSIS — J069 Acute upper respiratory infection, unspecified: Secondary | ICD-10-CM | POA: Insufficient documentation

## 2020-02-06 DIAGNOSIS — Z1152 Encounter for screening for COVID-19: Secondary | ICD-10-CM | POA: Diagnosis not present

## 2020-02-06 LAB — POCT RAPID STREP A (OFFICE): Rapid Strep A Screen: NEGATIVE

## 2020-02-06 MED ORDER — CETIRIZINE HCL 10 MG PO TABS: 10.0000 mg | ORAL_TABLET | Freq: Every day | ORAL | 0 refills | Status: DC

## 2020-02-06 MED ORDER — BENZONATATE 100 MG PO CAPS: 100.0000 mg | ORAL_CAPSULE | Freq: Three times a day (TID) | ORAL | 0 refills | Status: DC

## 2020-02-06 MED ORDER — FLUTICASONE PROPIONATE 50 MCG/ACT NA SUSP: 1.0000 | Freq: Every day | NASAL | 0 refills | Status: DC

## 2020-02-06 NOTE — Discharge Instructions (Signed)
Strep test was negative.  Sample be sent for culture and someone will call if your result is abnormal  COVID testing ordered.  It will take between 2-7 days for test results.  Someone will contact you regarding abnormal results.    In the meantime: You should remain isolated in your home for 10 days from symptom onset AND greater than 24 hours after symptoms resolution (absence of fever without the use of fever-reducing medication and improvement in respiratory symptoms), whichever is longer Get plenty of rest and push fluids Tessalon Perles prescribed for cough Zyrtec for nasal congestion, runny nose, and/or sore throat Flonase for nasal congestion and runny nose Use medications daily for symptom relief Use OTC medications like ibuprofen or tylenol as needed fever or pain Call or go to the ED if you have any new or worsening symptoms such as fever, worsening cough, shortness of breath, chest tightness, chest pain, turning blue, changes in mental status, etc..Marland Kitchen

## 2020-02-06 NOTE — ED Triage Notes (Signed)
Patient states that grandchildren have strep, patient does have sore throat x 12 hrs, "may be taking a little cold" wants covid test

## 2020-02-06 NOTE — ED Provider Notes (Signed)
[facility]   [id number] 02/06/20 Arrival Time: 1855   CC: Sore throat  SUBJECTIVE: History from: patient.  Sherry Reed is a 69 y.o. female presented to the urgent care with a complaint of sore throat, nasal congestion, ear pain for the past few days.  Denies recent travel.  Has tried OTC medication without relief.  Denies aggravating factors.  Denies previous symptoms in the past.   Denies fever, chills, fatigue, sinus pain, rhinorrhea, sore throat, SOB, wheezing, chest pain, nausea, changes in bowel or bladder habits.     ROS: As per HPI.  All other pertinent ROS negative.     Past Medical History:  Diagnosis Date  . Hypothyroidism    On thyroid replacement  . Observed sleep apnea    Status post UVPP  . SOB (shortness of breath)   . Thyroid disease    Past Surgical History:  Procedure Laterality Date  . THROAT SURGERY    . TONSILLECTOMY     No Known Allergies No current facility-administered medications on file prior to encounter.   Current Outpatient Medications on File Prior to Encounter  Medication Sig Dispense Refill  . calcium gluconate 500 MG tablet Take 1 tablet by mouth 3 (three) times daily.    Marland Kitchen CRANBERRY PO Take 1 capsule by mouth daily.    . Glucosamine-Chondroit-Vit C-Mn (GLUCOSAMINE 1500 COMPLEX PO) Take 1 tablet by mouth daily.    Marland Kitchen ibandronate (BONIVA) 150 MG tablet Take 1 tablet (150 mg total) by mouth every 30 (thirty) days. Take in the morning with a full glass of water, on an empty stomach, and do not take anything else by mouth or lie down for the next 30 min. 1 tablet 10  . levothyroxine (SYNTHROID, LEVOTHROID) 125 MCG tablet Take 1 tablet by mouth daily.    . meloxicam (MOBIC) 7.5 MG tablet TAKE 1 TABLET BY MOUTH EVERY DAY 90 tablet 1  . Multiple Vitamin (MULTIVITAMIN WITH MINERALS) TABS tablet Take 1 tablet by mouth daily.    . Omega-3 Fatty Acids (FISH OIL) 1000 MG CAPS Take 1 capsule by mouth daily.     Social History    Socioeconomic History  . Marital status: Married    Spouse name: Not on file  . Number of children: Not on file  . Years of education: Not on file  . Highest education level: Not on file  Occupational History  . Not on file  Tobacco Use  . Smoking status: Never Smoker  . Smokeless tobacco: Never Used  Substance and Sexual Activity  . Alcohol use: Yes    Comment: rare  . Drug use: No  . Sexual activity: Not on file  Other Topics Concern  . Not on file  Social History Narrative  . Not on file   Social Determinants of Health   Financial Resource Strain:   . Difficulty of Paying Living Expenses: Not on file  Food Insecurity:   . Worried About Charity fundraiser in the Last Year: Not on file  . Ran Out of Food in the Last Year: Not on file  Transportation Needs:   . Lack of Transportation (Medical): Not on file  . Lack of Transportation (Non-Medical): Not on file  Physical Activity:   . Days of Exercise per Week: Not on file  . Minutes of Exercise per Session: Not on file  Stress:   . Feeling of Stress : Not on file  Social Connections:   . Frequency of Communication with  Friends and Family: Not on file  . Frequency of Social Gatherings with Friends and Family: Not on file  . Attends Religious Services: Not on file  . Active Member of Clubs or Organizations: Not on file  . Attends Archivist Meetings: Not on file  . Marital Status: Not on file  Intimate Partner Violence:   . Fear of Current or Ex-Partner: Not on file  . Emotionally Abused: Not on file  . Physically Abused: Not on file  . Sexually Abused: Not on file   Family History  Problem Relation Age of Onset  . Diabetes Father     OBJECTIVE:  Vitals:   02/06/20 1946  BP: (!) 155/86  Pulse: 84  Resp: 16  Temp: 98.1 F (36.7 C)  SpO2: 98%     General appearance: alert; appears fatigued, but nontoxic; speaking in full sentences and tolerating own secretions HEENT: NCAT; Ears: EACs clear,  TMs pearly gray; Eyes: PERRL.  EOM grossly intact. Sinuses: nontender; Nose: nares patent without rhinorrhea, Throat: oropharynx clear, tonsils non erythematous or enlarged, uvula midline  Neck: supple without LAD Lungs: unlabored respirations, symmetrical air entry; cough: absent, mild; no respiratory distress; CTAB Heart: regular rate and rhythm.  Radial pulses 2+ symmetrical bilaterally Skin: warm and dry Psychological: alert and cooperative; normal mood and affect  LABS:  No results found for this or any previous visit (from the past 24 hour(s)).   ASSESSMENT & PLAN:  1. Sore throat   2. Encounter for screening for COVID-19   3. Viral URI with cough     Meds ordered this encounter  Medications  . fluticasone (FLONASE) 50 MCG/ACT nasal spray    Sig: Place 1 spray into both nostrils daily for 14 days.    Dispense:  16 g    Refill:  0  . cetirizine (ZYRTEC ALLERGY) 10 MG tablet    Sig: Take 1 tablet (10 mg total) by mouth daily.    Dispense:  30 tablet    Refill:  0  . benzonatate (TESSALON) 100 MG capsule    Sig: Take 1 capsule (100 mg total) by mouth every 8 (eight) hours.    Dispense:  30 capsule    Refill:  0   Discharge instructions  Strep test was negative.  Sample be sent for culture and someone will call if your result is abnormal  COVID testing ordered.  It will take between 2-7 days for test results.  Someone will contact you regarding abnormal results.    In the meantime: You should remain isolated in your home for 10 days from symptom onset AND greater than 24 hours after symptoms resolution (absence of fever without the use of fever-reducing medication and improvement in respiratory symptoms), whichever is longer Get plenty of rest and push fluids Tessalon Perles prescribed for cough Zyrtec for nasal congestion, runny nose, and/or sore throat Flonase for nasal congestion and runny nose Use medications daily for symptom relief Use OTC medications like  ibuprofen or tylenol as needed fever or pain Call or go to the ED if you have any new or worsening symptoms such as fever, worsening cough, shortness of breath, chest tightness, chest pain, turning blue, changes in mental status, etc...   Reviewed expectations re: course of current medical issues. Questions answered. Outlined signs and symptoms indicating need for more acute intervention. Patient verbalized understanding. After Visit Summary given.      Marland KitchenHenrene Dodge, FNP 02/06/20 2008

## 2020-02-07 LAB — NOVEL CORONAVIRUS, NAA: SARS-CoV-2, NAA: NOT DETECTED

## 2020-02-07 LAB — SARS-COV-2, NAA 2 DAY TAT

## 2020-02-10 LAB — CULTURE, GROUP A STREP (THRC)

## 2020-02-12 ENCOUNTER — Other Ambulatory Visit (INDEPENDENT_AMBULATORY_CARE_PROVIDER_SITE_OTHER): Payer: Self-pay | Admitting: Orthopaedic Surgery

## 2020-02-17 DIAGNOSIS — R0782 Intercostal pain: Secondary | ICD-10-CM | POA: Diagnosis not present

## 2020-02-17 DIAGNOSIS — Z0189 Encounter for other specified special examinations: Secondary | ICD-10-CM | POA: Diagnosis not present

## 2020-02-17 DIAGNOSIS — R0602 Shortness of breath: Secondary | ICD-10-CM | POA: Diagnosis not present

## 2020-02-17 DIAGNOSIS — R3981 Functional urinary incontinence: Secondary | ICD-10-CM | POA: Diagnosis not present

## 2020-02-17 DIAGNOSIS — Z1331 Encounter for screening for depression: Secondary | ICD-10-CM | POA: Diagnosis not present

## 2020-02-17 DIAGNOSIS — R509 Fever, unspecified: Secondary | ICD-10-CM | POA: Diagnosis not present

## 2020-02-17 DIAGNOSIS — R300 Dysuria: Secondary | ICD-10-CM | POA: Diagnosis not present

## 2020-02-17 DIAGNOSIS — J302 Other seasonal allergic rhinitis: Secondary | ICD-10-CM | POA: Diagnosis not present

## 2020-02-17 DIAGNOSIS — Z0000 Encounter for general adult medical examination without abnormal findings: Secondary | ICD-10-CM | POA: Diagnosis not present

## 2020-02-17 DIAGNOSIS — M816 Localized osteoporosis [Lequesne]: Secondary | ICD-10-CM | POA: Diagnosis not present

## 2020-02-17 DIAGNOSIS — R319 Hematuria, unspecified: Secondary | ICD-10-CM | POA: Diagnosis not present

## 2020-02-17 DIAGNOSIS — M810 Age-related osteoporosis without current pathological fracture: Secondary | ICD-10-CM | POA: Diagnosis not present

## 2020-02-24 DIAGNOSIS — N3946 Mixed incontinence: Secondary | ICD-10-CM | POA: Diagnosis not present

## 2020-03-04 DIAGNOSIS — R32 Unspecified urinary incontinence: Secondary | ICD-10-CM | POA: Diagnosis not present

## 2020-03-04 DIAGNOSIS — E039 Hypothyroidism, unspecified: Secondary | ICD-10-CM | POA: Diagnosis not present

## 2020-03-04 DIAGNOSIS — K581 Irritable bowel syndrome with constipation: Secondary | ICD-10-CM | POA: Diagnosis not present

## 2020-03-04 DIAGNOSIS — Z0001 Encounter for general adult medical examination with abnormal findings: Secondary | ICD-10-CM | POA: Diagnosis not present

## 2020-03-04 DIAGNOSIS — B351 Tinea unguium: Secondary | ICD-10-CM | POA: Diagnosis not present

## 2020-03-04 DIAGNOSIS — R944 Abnormal results of kidney function studies: Secondary | ICD-10-CM | POA: Diagnosis not present

## 2020-03-04 DIAGNOSIS — E559 Vitamin D deficiency, unspecified: Secondary | ICD-10-CM | POA: Diagnosis not present

## 2020-03-10 DIAGNOSIS — N3941 Urge incontinence: Secondary | ICD-10-CM | POA: Diagnosis not present

## 2020-03-10 DIAGNOSIS — R152 Fecal urgency: Secondary | ICD-10-CM | POA: Diagnosis not present

## 2020-03-10 DIAGNOSIS — R151 Fecal smearing: Secondary | ICD-10-CM | POA: Diagnosis not present

## 2020-04-06 DIAGNOSIS — J302 Other seasonal allergic rhinitis: Secondary | ICD-10-CM | POA: Diagnosis not present

## 2020-04-06 DIAGNOSIS — R300 Dysuria: Secondary | ICD-10-CM | POA: Diagnosis not present

## 2020-04-06 DIAGNOSIS — R319 Hematuria, unspecified: Secondary | ICD-10-CM | POA: Diagnosis not present

## 2020-04-06 DIAGNOSIS — Z0000 Encounter for general adult medical examination without abnormal findings: Secondary | ICD-10-CM | POA: Diagnosis not present

## 2020-04-06 DIAGNOSIS — R0782 Intercostal pain: Secondary | ICD-10-CM | POA: Diagnosis not present

## 2020-04-06 DIAGNOSIS — Z0001 Encounter for general adult medical examination with abnormal findings: Secondary | ICD-10-CM | POA: Diagnosis not present

## 2020-04-06 DIAGNOSIS — R3981 Functional urinary incontinence: Secondary | ICD-10-CM | POA: Diagnosis not present

## 2020-04-06 DIAGNOSIS — Z0189 Encounter for other specified special examinations: Secondary | ICD-10-CM | POA: Diagnosis not present

## 2020-04-06 DIAGNOSIS — Z1331 Encounter for screening for depression: Secondary | ICD-10-CM | POA: Diagnosis not present

## 2020-04-15 DIAGNOSIS — N3941 Urge incontinence: Secondary | ICD-10-CM | POA: Diagnosis not present

## 2020-04-15 DIAGNOSIS — R152 Fecal urgency: Secondary | ICD-10-CM | POA: Diagnosis not present

## 2020-04-15 DIAGNOSIS — M6281 Muscle weakness (generalized): Secondary | ICD-10-CM | POA: Diagnosis not present

## 2020-04-27 DIAGNOSIS — E039 Hypothyroidism, unspecified: Secondary | ICD-10-CM | POA: Diagnosis not present

## 2020-04-29 DIAGNOSIS — N3946 Mixed incontinence: Secondary | ICD-10-CM | POA: Diagnosis not present

## 2020-04-29 DIAGNOSIS — R350 Frequency of micturition: Secondary | ICD-10-CM | POA: Diagnosis not present

## 2020-06-11 ENCOUNTER — Ambulatory Visit: Payer: Medicare PPO | Admitting: Internal Medicine

## 2020-06-15 DIAGNOSIS — M62838 Other muscle spasm: Secondary | ICD-10-CM | POA: Diagnosis not present

## 2020-06-15 DIAGNOSIS — N3941 Urge incontinence: Secondary | ICD-10-CM | POA: Diagnosis not present

## 2020-06-15 DIAGNOSIS — R152 Fecal urgency: Secondary | ICD-10-CM | POA: Diagnosis not present

## 2020-06-21 DIAGNOSIS — L570 Actinic keratosis: Secondary | ICD-10-CM | POA: Diagnosis not present

## 2020-06-21 DIAGNOSIS — L819 Disorder of pigmentation, unspecified: Secondary | ICD-10-CM | POA: Diagnosis not present

## 2020-06-21 DIAGNOSIS — D1801 Hemangioma of skin and subcutaneous tissue: Secondary | ICD-10-CM | POA: Diagnosis not present

## 2020-06-21 DIAGNOSIS — L821 Other seborrheic keratosis: Secondary | ICD-10-CM | POA: Diagnosis not present

## 2020-06-21 DIAGNOSIS — L814 Other melanin hyperpigmentation: Secondary | ICD-10-CM | POA: Diagnosis not present

## 2020-06-21 DIAGNOSIS — I8393 Asymptomatic varicose veins of bilateral lower extremities: Secondary | ICD-10-CM | POA: Diagnosis not present

## 2020-06-21 DIAGNOSIS — D229 Melanocytic nevi, unspecified: Secondary | ICD-10-CM | POA: Diagnosis not present

## 2020-06-21 DIAGNOSIS — L812 Freckles: Secondary | ICD-10-CM | POA: Diagnosis not present

## 2020-06-27 IMAGING — CT CT ABD-PELV W/O CM
2 of 4 series · 16 of 46 positions shown, 18 images · non-contrast
Comparison: None.

CLINICAL DATA: Right lower quadrant abdominal pain for the past 2
months.

EXAM:
CT ABDOMEN AND PELVIS WITHOUT CONTRAST
TECHNIQUE: Multidetector CT imaging of the abdomen and pelvis was performed
following the standard protocol without IV contrast.

[Series 2: axial st · axial · 0.73mm/px · z∈[+912,+1347]mm · 13 of 97 slices shown, 15 images]
[im 5/97  soft-tissue]
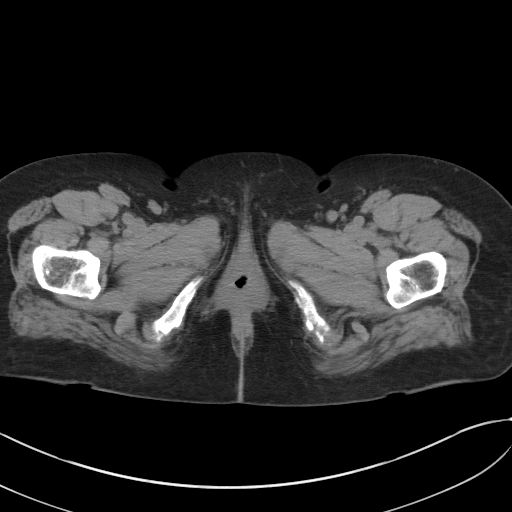
[im 5/97  bone]
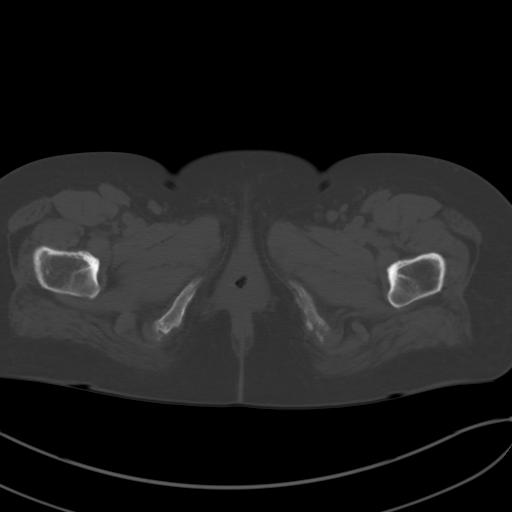
[im 13/97  soft-tissue]
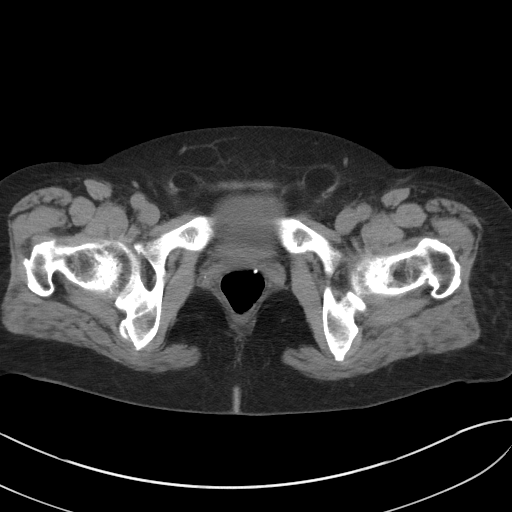
[im 21/97  soft-tissue]
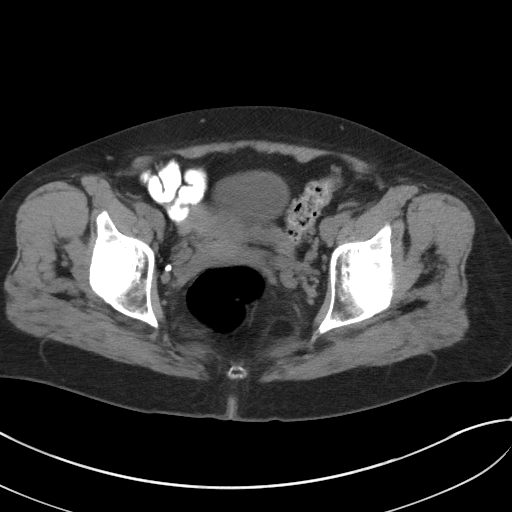
[im 26/97  soft-tissue]
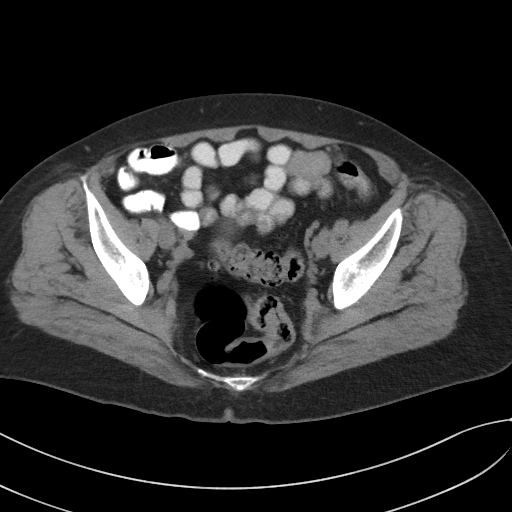
[im 34/97  soft-tissue]
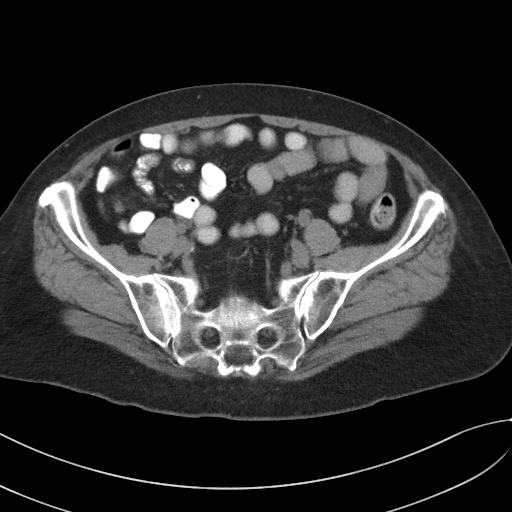
[im 42/97  soft-tissue]
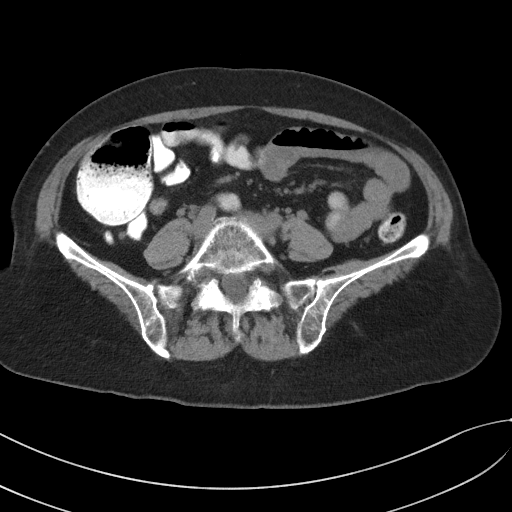
[im 51/97  soft-tissue]
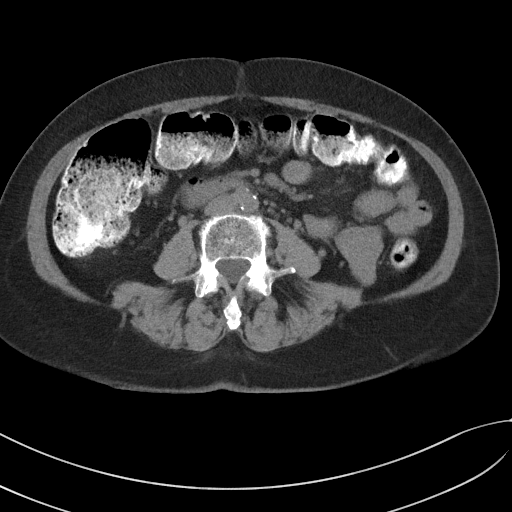
[im 55/97  soft-tissue]
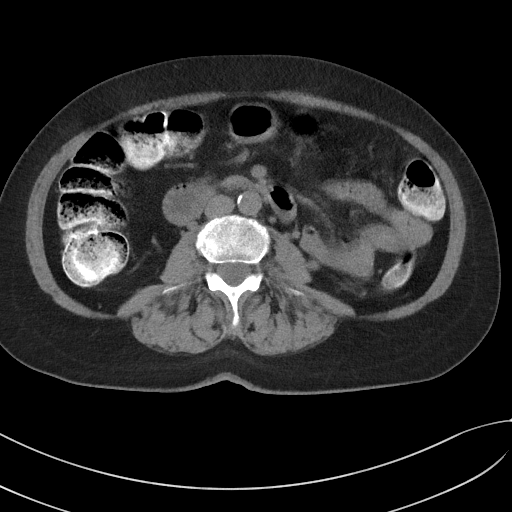
[im 63/97  soft-tissue]
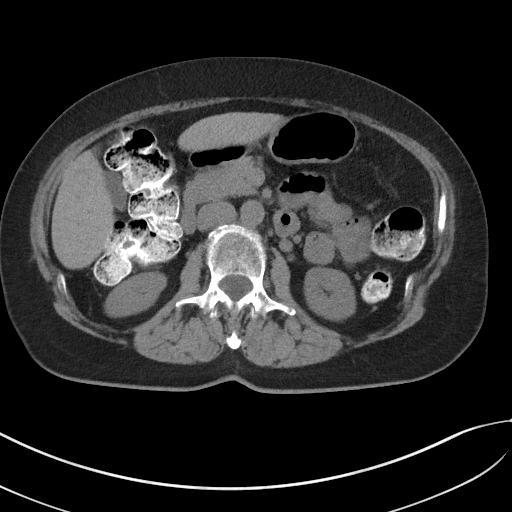
[im 63/97  bone]
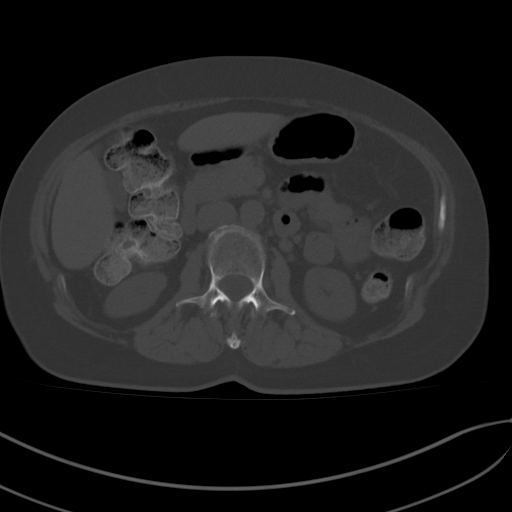
[im 71/97  soft-tissue]
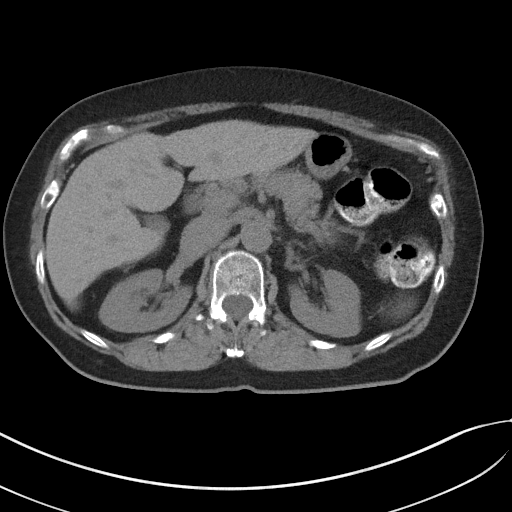
[im 76/97  soft-tissue]
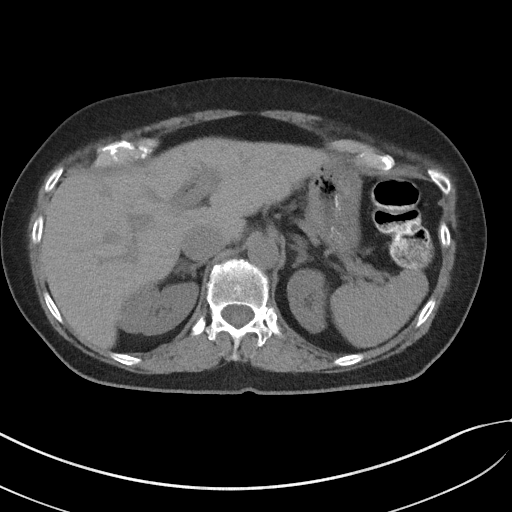
[im 84/97  soft-tissue]
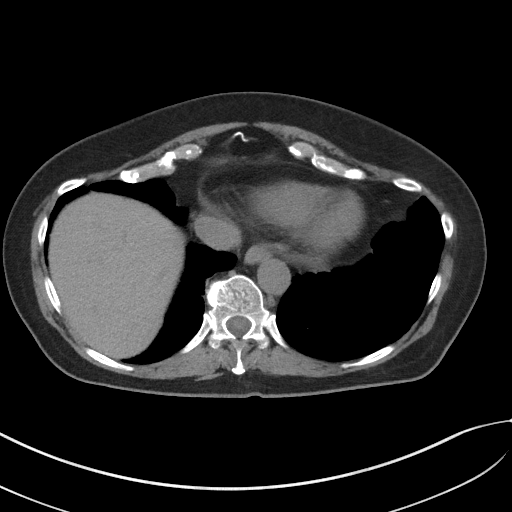
[im 92/97  soft-tissue]
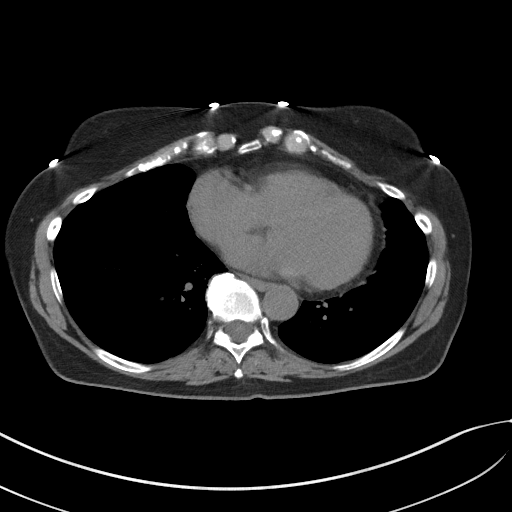

[Series 5: coronal st · coronal · 0.76mm/px · 3 of 85 slices shown]
[im 29/85  soft-tissue]
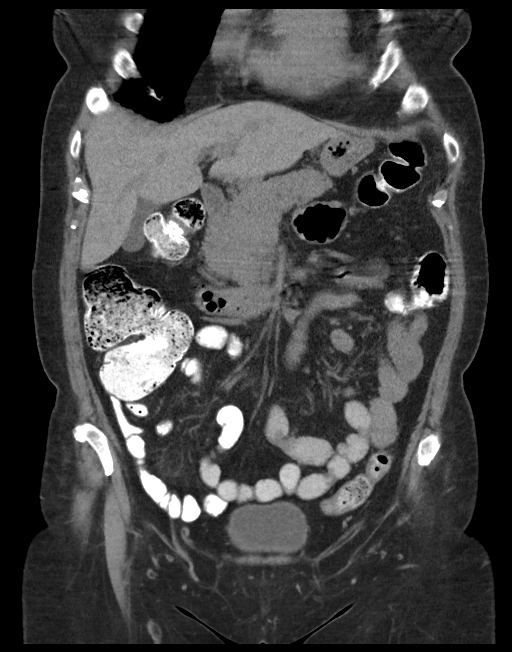
[im 38/85  soft-tissue]
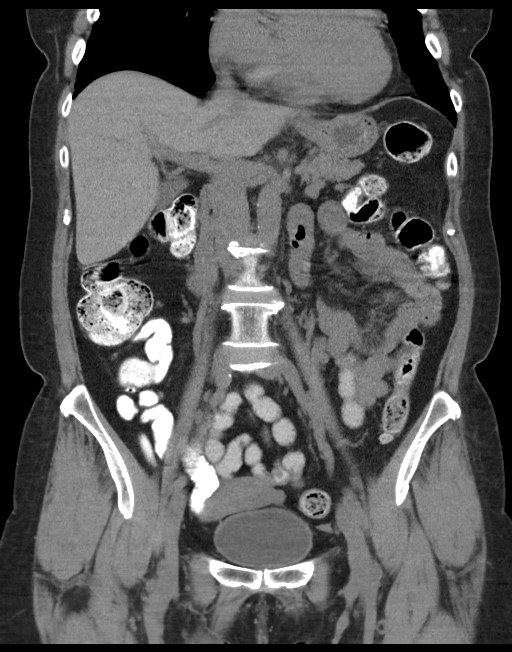
[im 47/85  soft-tissue]
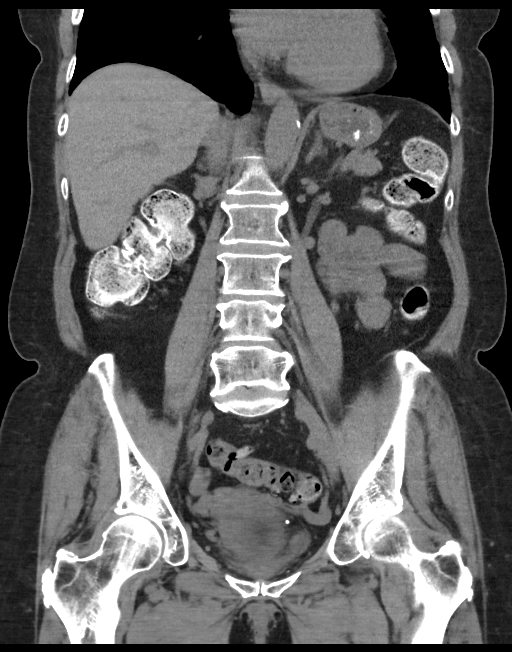

[16 of 46 positions shown; findings below may reference images not displayed]

FINDINGS: Lower chest: Mildly enlarged heart. Minimal bibasilar atelectasis or
scarring.

Hepatobiliary: No focal liver abnormality is seen. No gallstones,
gallbladder wall thickening, or biliary dilatation.

Pancreas: Unremarkable. No pancreatic ductal dilatation or
surrounding inflammatory changes.

Spleen: Normal in size without focal abnormality.

Adrenals/Urinary Tract: Adrenal glands are unremarkable. Kidneys are
normal, without renal calculi, focal lesion, or hydronephrosis.
Bladder is unremarkable.

Stomach/Bowel: Scattered sigmoid and descending colon diverticula.
Normal appearing appendix, stomach and small bowel.

Vascular/Lymphatic: Mild atheromatous arterial calcifications. No
enlarged lymph nodes.

Reproductive: Uterus and bilateral adnexa are unremarkable.

Other: Small bilateral inguinal hernias containing fat. Small
umbilical hernia containing fat.

Musculoskeletal: Bilateral L5 pars interarticularis defects. No
spondylolisthesis. Mild lumbar and moderate lower thoracic spine
degenerative changes.
IMPRESSION: 1. No acute abnormality.
2. Mild colonic diverticulosis.
3. Bilateral L5 spondylolysis without spondylolisthesis.

## 2020-07-13 DIAGNOSIS — R350 Frequency of micturition: Secondary | ICD-10-CM | POA: Diagnosis not present

## 2020-07-13 DIAGNOSIS — N3941 Urge incontinence: Secondary | ICD-10-CM | POA: Diagnosis not present

## 2020-07-13 DIAGNOSIS — R151 Fecal smearing: Secondary | ICD-10-CM | POA: Diagnosis not present

## 2020-07-14 ENCOUNTER — Ambulatory Visit (INDEPENDENT_AMBULATORY_CARE_PROVIDER_SITE_OTHER): Payer: Medicare PPO

## 2020-07-14 ENCOUNTER — Ambulatory Visit: Payer: Medicare PPO | Admitting: Orthopaedic Surgery

## 2020-07-14 DIAGNOSIS — M5441 Lumbago with sciatica, right side: Secondary | ICD-10-CM

## 2020-07-14 DIAGNOSIS — M25551 Pain in right hip: Secondary | ICD-10-CM | POA: Diagnosis not present

## 2020-07-14 MED ORDER — METHOCARBAMOL 500 MG PO TABS: 500.0000 mg | ORAL_TABLET | Freq: Four times a day (QID) | ORAL | 1 refills | Status: DC | PRN

## 2020-07-14 MED ORDER — METHYLPREDNISOLONE 4 MG PO TABS: ORAL_TABLET | ORAL | 0 refills | Status: DC
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
## 2020-07-28 ENCOUNTER — Ambulatory Visit: Payer: Medicare PPO | Admitting: Internal Medicine

## 2020-08-25 ENCOUNTER — Encounter: Payer: Self-pay | Admitting: Gastroenterology

## 2020-08-25 ENCOUNTER — Other Ambulatory Visit: Payer: Medicare PPO

## 2020-08-25 ENCOUNTER — Other Ambulatory Visit: Payer: Self-pay

## 2020-08-25 ENCOUNTER — Ambulatory Visit: Payer: Medicare PPO | Admitting: Gastroenterology

## 2020-08-25 VITALS — BP 148/86 | HR 76 | Ht 66.0 in | Wt 166.6 lb

## 2020-08-25 DIAGNOSIS — R151 Fecal smearing: Secondary | ICD-10-CM | POA: Insufficient documentation

## 2020-08-25 DIAGNOSIS — R194 Change in bowel habit: Secondary | ICD-10-CM | POA: Diagnosis not present

## 2020-08-25 DIAGNOSIS — N819 Female genital prolapse, unspecified: Secondary | ICD-10-CM | POA: Diagnosis not present

## 2020-08-25 DIAGNOSIS — Z01419 Encounter for gynecological examination (general) (routine) without abnormal findings: Secondary | ICD-10-CM | POA: Diagnosis not present

## 2020-08-25 DIAGNOSIS — K9289 Other specified diseases of the digestive system: Secondary | ICD-10-CM

## 2020-08-25 DIAGNOSIS — Z1231 Encounter for screening mammogram for malignant neoplasm of breast: Secondary | ICD-10-CM | POA: Diagnosis not present

## 2020-08-25 DIAGNOSIS — M8580 Other specified disorders of bone density and structure, unspecified site: Secondary | ICD-10-CM | POA: Diagnosis not present

## 2020-08-25 DIAGNOSIS — N898 Other specified noninflammatory disorders of vagina: Secondary | ICD-10-CM | POA: Diagnosis not present

## 2020-08-25 DIAGNOSIS — Z1211 Encounter for screening for malignant neoplasm of colon: Secondary | ICD-10-CM | POA: Diagnosis not present

## 2020-08-25 DIAGNOSIS — Z6828 Body mass index (BMI) 28.0-28.9, adult: Secondary | ICD-10-CM | POA: Diagnosis not present

## 2020-08-25 MED ORDER — RIFAXIMIN 550 MG PO TABS: 550.0000 mg | ORAL_TABLET | Freq: Three times a day (TID) | ORAL | 0 refills | Status: DC

## 2020-08-25 NOTE — Patient Instructions (Addendum)
If you are age 72 or older, your body mass index should be between 23-30. Your Body mass index is 26.89 kg/m. If this is out of the aforementioned range listed, please consider follow up with your Primary Care Provider.  If you are age 55 or younger, your body mass index should be between 19-25. Your Body mass index is 26.89 kg/m. If this is out of the aformentioned range listed, please consider follow up with your Primary Care Provider.   We will get your records from Dr Collene Mares for review   Please start Benefiber, 2 teaspoon in [place] of water daily. Increase to twice a day if needed.  Look into changing a probiotics to Florastor probiotic.  Follow up in 4-6 weeks. Please call the office to schedule. [phone]  We have sent your demographic information and a prescription for Xifaxan to [facility]. This pharmacy is able to get medication approved through insurance and get you the lowest copay possible. If you have not heard from them within 1 week, please call our office at [phone] to let us know.  Your provider has requested that you go to the basement level for lab work before leaving today. Press "B" on the elevator. The lab is located at the first door on the left as you exit the elevator.  Due to recent changes in healthcare laws, you may see the results of your imaging and laboratory studies on MyChart before your provider has had a chance to review them.  We understand that in some cases there may be results that are confusing or concerning to you. Not all laboratory results come back in the same time frame and the provider may be waiting for multiple results in order to interpret others.  Please give [place] 48 hours in order for your provider to thoroughly review all the results before contacting the office for clarification of your results.    Thank you for choosing me and [facility].  Alonza Bogus, PA-C

## 2020-08-25 NOTE — Progress Notes (Addendum)
08/25/2020 CROSBY BEVAN [id number] December 02, 1950   HISTORY OF PRESENT ILLNESS: This is a 70 year old female who is new to our office.  She has been referred here by her PCP, Dr. Nevada Crane and Dr. Matilde Sprang, for evaluation regarding gas and fecal incontinence.  She tells me that the symptoms have been present for quite some time.  She says that anytime she puts almost anything in her mouth it immediately begins to cause her issues with gas.  The gas is horrendous and uncontrollable.  It is to the point that she will not eat anything in the company of anybody so not to be embarrassed, etc.  She also gets episodes of diarrhea with urgency.  On occasion she will have small amounts of leakage of fecal material.  She says that she has stopped dairy for period of time and that did not seem to help.  She takes restore a probiotic daily.  She says that her PCP prescribed Xifaxan about a year ago and she believes that she did have improvement with that for a period of time.  She says that she has had about 2 colonoscopies in the past by Dr. Collene Mares, the last probably just a few years ago.  She denies any rectal bleeding no significant abdominal pain.   Past Medical History:  Diagnosis Date  . Arthritis   . Female cystocele    grade 2  . Hearing loss    bilateral  . Hypothyroidism    On thyroid replacement  . Observed sleep apnea    Status post UVPP  . Rectocele    grade 1  . Sleep apnea   . SOB (shortness of breath)   . Stress incontinence   . Thyroid disease    Past Surgical History:  Procedure Laterality Date  . THROAT SURGERY    . TONSILLECTOMY      reports that she has never smoked. She has never used smokeless tobacco. She reports current alcohol use. She reports that she does not use drugs. family history includes Diabetes in her father; Heart disease in her father; Pneumonia in her mother. No Known Allergies    Outpatient Encounter Medications as of 08/25/2020  Medication Sig  .  Cholecalciferol 1.25 MG (50000 UT) capsule cholecalciferol (vitamin D3) 1,250 mcg (50,000 unit) capsule  TAKE 1 CAPSULE(S) EVERY WEEK BY ORAL ROUTE FOR 56 DAYS.  Marland Kitchen levothyroxine (SYNTHROID) 112 MCG tablet Take 112 mcg by mouth daily before breakfast.  . meloxicam (MOBIC) 7.5 MG tablet TAKE 1 TABLET BY MOUTH EVERY DAY  . Multiple Vitamin (MULTIVITAMIN WITH MINERALS) TABS tablet Take 1 tablet by mouth daily.  . solifenacin (VESICARE) 5 MG tablet solifenacin 5 mg tablet  TAKE 1 TABLET BY MOUTH EVERY DAY  . [DISCONTINUED] benzonatate (TESSALON) 100 MG capsule Take 1 capsule (100 mg total) by mouth every 8 (eight) hours.  . [DISCONTINUED] calcium gluconate 500 MG tablet Take 1 tablet by mouth 3 (three) times daily.  . [DISCONTINUED] cetirizine (ZYRTEC ALLERGY) 10 MG tablet Take 1 tablet (10 mg total) by mouth daily.  . [DISCONTINUED] CRANBERRY PO Take 1 capsule by mouth daily.  . [DISCONTINUED] fluticasone (FLONASE) 50 MCG/ACT nasal spray Place 1 spray into both nostrils daily for 14 days.  . [DISCONTINUED] Glucosamine-Chondroit-Vit C-Mn (GLUCOSAMINE 1500 COMPLEX PO) Take 1 tablet by mouth daily.  . [DISCONTINUED] ibandronate (BONIVA) 150 MG tablet Take 1 tablet (150 mg total) by mouth every 30 (thirty) days. Take in the morning with a full glass of  water, on an empty stomach, and do not take anything else by mouth or lie down for the next 30 min.  . [DISCONTINUED] levothyroxine (SYNTHROID, LEVOTHROID) 125 MCG tablet Take 1 tablet by mouth daily.  . [DISCONTINUED] methocarbamol (ROBAXIN) 500 MG tablet Take 1 tablet (500 mg total) by mouth every 6 (six) hours as needed.  . [DISCONTINUED] methylPREDNISolone (MEDROL) 4 MG tablet Medrol dose pack. Take as instructed  . [DISCONTINUED] Omega-3 Fatty Acids (FISH OIL) 1000 MG CAPS Take 1 capsule by mouth daily.   No facility-administered encounter medications on file as of 08/25/2020.    REVIEW OF SYSTEMS  : All other systems reviewed and negative except  where noted in the History of Present Illness.   PHYSICAL EXAM: BP (!) 148/86   Pulse 76   Ht 5\' 6"  (1.676 m)   Wt 166 lb 9.6 oz (75.6 kg)   BMI 26.89 kg/m  General: Well developed white female in no acute distress Head: Normocephalic and atraumatic Eyes:  Sclerae anicteric, conjunctiva pink. Ears: Normal auditory acuity Lungs: Clear throughout to auscultation; no W/R/R. Heart: Regular rate and rhythm; no M/R/G. Abdomen: Soft, non-distended.  BS present.  Minimal diffuse TTP. Musculoskeletal: Symmetrical with no gross deformities  Skin: No lesions on visible extremities Extremities: No edema  Neurological: Alert oriented x 4, grossly non-focal Psychological:  Alert and cooperative. Normal mood and affect  ASSESSMENT AND PLAN: *70 year old female with primary complaint of significant post-prandial gas and episodes of diarrhea with urgency and occasional fecal leakage.  She has had 2 colonoscopies in the past by Dr. Collene Mares, the last probably just a few years ago.  We will have her sign to obtain those records for review.  She reported improvement with Xifaxan in the past when taken after it was prescribed by her PCP about a year ago.  We could certainly try that again for empiric treatment of SIBO/IBS-D.  We will send that prescription to specialty pharmacy.  She can continue her Restora or she could try something along the lines of Florastor or align instead.  I also recommend that she begin taking Benefiber powder 2 teaspoons mixed in 8 ounces of liquid daily and increase to twice daily if needed/tolerated to help create bulk to her stools.  We will check celiac labs.  We also discussed the FODMAP diet and she was given literature on this.  She will follow up here in about 4 to 6 weeks for reassessment.  **Addendum: She had a colonoscopy in April 2005 at which time she had a small rectal polyp removed.  That was hyperplastic on pathology.  Random colon biopsies were also benign without any  abnormalities.  She then had another colonoscopy in May 2015 with only few scattered diverticula in the sigmoid colon.  Repeat was recommended at another 10-year interval.   CC:  Celene Squibb, MD  CC:  Dr. Matilde Sprang

## 2020-08-26 LAB — IGA: Immunoglobulin A: 177 mg/dL (ref 70–320)

## 2020-08-26 LAB — TISSUE TRANSGLUTAMINASE, IGA: (tTG) Ab, IgA: <1 U/mL

## 2020-08-26 NOTE — Progress Notes (Signed)
Noted  

## 2020-09-07 DIAGNOSIS — Z23 Encounter for immunization: Secondary | ICD-10-CM | POA: Diagnosis not present

## 2020-09-17 ENCOUNTER — Telehealth: Payer: Self-pay | Admitting: Gastroenterology

## 2020-09-17 NOTE — Telephone Encounter (Signed)
Per [facility], its still pending. They will go ahead and push it as expedited due to the delay. Left a voicemail to inform the patient of this

## 2020-09-17 NOTE — Telephone Encounter (Signed)
Inbound call from patient. States she haven't gotten any medications from [facility]. Best contact for patient [phone]

## 2020-09-21 ENCOUNTER — Ambulatory Visit: Payer: Medicare PPO | Admitting: Internal Medicine

## 2020-09-23 DIAGNOSIS — R509 Fever, unspecified: Secondary | ICD-10-CM | POA: Diagnosis not present

## 2020-09-23 DIAGNOSIS — R319 Hematuria, unspecified: Secondary | ICD-10-CM | POA: Diagnosis not present

## 2020-09-23 DIAGNOSIS — Z0001 Encounter for general adult medical examination with abnormal findings: Secondary | ICD-10-CM | POA: Diagnosis not present

## 2020-09-23 DIAGNOSIS — Z0189 Encounter for other specified special examinations: Secondary | ICD-10-CM | POA: Diagnosis not present

## 2020-09-23 DIAGNOSIS — R3981 Functional urinary incontinence: Secondary | ICD-10-CM | POA: Diagnosis not present

## 2020-09-23 DIAGNOSIS — R0782 Intercostal pain: Secondary | ICD-10-CM | POA: Diagnosis not present

## 2020-09-23 DIAGNOSIS — Z1331 Encounter for screening for depression: Secondary | ICD-10-CM | POA: Diagnosis not present

## 2020-09-23 DIAGNOSIS — J302 Other seasonal allergic rhinitis: Secondary | ICD-10-CM | POA: Diagnosis not present

## 2020-09-23 DIAGNOSIS — Z0000 Encounter for general adult medical examination without abnormal findings: Secondary | ICD-10-CM | POA: Diagnosis not present

## 2020-09-23 DIAGNOSIS — R300 Dysuria: Secondary | ICD-10-CM | POA: Diagnosis not present

## 2020-09-24 NOTE — Telephone Encounter (Signed)
Authorized until 12-22-20

## 2020-09-28 DIAGNOSIS — K580 Irritable bowel syndrome with diarrhea: Secondary | ICD-10-CM | POA: Diagnosis not present

## 2020-09-28 DIAGNOSIS — R944 Abnormal results of kidney function studies: Secondary | ICD-10-CM | POA: Diagnosis not present

## 2020-09-28 DIAGNOSIS — E559 Vitamin D deficiency, unspecified: Secondary | ICD-10-CM | POA: Insufficient documentation

## 2020-09-28 DIAGNOSIS — E782 Mixed hyperlipidemia: Secondary | ICD-10-CM | POA: Diagnosis not present

## 2020-09-28 DIAGNOSIS — N3946 Mixed incontinence: Secondary | ICD-10-CM | POA: Diagnosis not present

## 2020-09-28 DIAGNOSIS — M8580 Other specified disorders of bone density and structure, unspecified site: Secondary | ICD-10-CM | POA: Diagnosis not present

## 2020-09-28 DIAGNOSIS — E039 Hypothyroidism, unspecified: Secondary | ICD-10-CM | POA: Diagnosis not present

## 2020-09-28 DIAGNOSIS — M7631 Iliotibial band syndrome, right leg: Secondary | ICD-10-CM | POA: Diagnosis not present

## 2020-09-30 DIAGNOSIS — R350 Frequency of micturition: Secondary | ICD-10-CM | POA: Diagnosis not present

## 2020-09-30 DIAGNOSIS — N3946 Mixed incontinence: Secondary | ICD-10-CM | POA: Diagnosis not present

## 2020-10-04 NOTE — Telephone Encounter (Signed)
Returned patient call . Left her a voicemail to call [facility] as our office did receive a fax indicating approval.then after the approval, [facility] reaches out to patients and schedule the shipment of Xifaxan I also gave her the number of [phone]

## 2020-10-04 NOTE — Telephone Encounter (Signed)
Pt called stating that she has not yet heard or received her medication from [facility]. Pls call her.

## 2020-10-18 DIAGNOSIS — H90A22 Sensorineural hearing loss, unilateral, left ear, with restricted hearing on the contralateral side: Secondary | ICD-10-CM | POA: Diagnosis not present

## 2020-10-18 DIAGNOSIS — H90A31 Mixed conductive and sensorineural hearing loss, unilateral, right ear with restricted hearing on the contralateral side: Secondary | ICD-10-CM | POA: Diagnosis not present

## 2020-11-10 ENCOUNTER — Telehealth: Payer: Self-pay | Admitting: Orthopaedic Surgery

## 2020-11-10 ENCOUNTER — Other Ambulatory Visit: Payer: Self-pay

## 2020-11-10 MED ORDER — MELOXICAM 7.5 MG PO TABS
7.5000 mg | ORAL_TABLET | Freq: Every day | ORAL | 1 refills | Status: DC
Start: 2020-11-10 — End: 2021-02-14

## 2020-11-10 NOTE — Telephone Encounter (Signed)
Sent to [facility].

## 2020-11-10 NOTE — Telephone Encounter (Signed)
Patient called regarding refill for meloxicam she is requesting rx to be sent to [facility] [place], [address] AT [address] call back:[phone]

## 2020-11-10 NOTE — Telephone Encounter (Signed)
Patient called. She would like a refill on meloxicam. Her call back number is [phone]

## 2020-11-18 DIAGNOSIS — E039 Hypothyroidism, unspecified: Secondary | ICD-10-CM | POA: Diagnosis not present

## 2020-11-18 DIAGNOSIS — E559 Vitamin D deficiency, unspecified: Secondary | ICD-10-CM | POA: Diagnosis not present

## 2020-11-23 DIAGNOSIS — R350 Frequency of micturition: Secondary | ICD-10-CM | POA: Insufficient documentation

## 2020-11-23 DIAGNOSIS — N390 Urinary tract infection, site not specified: Secondary | ICD-10-CM | POA: Diagnosis not present

## 2020-11-23 DIAGNOSIS — E039 Hypothyroidism, unspecified: Secondary | ICD-10-CM | POA: Diagnosis not present

## 2021-02-11 ENCOUNTER — Other Ambulatory Visit: Payer: Self-pay | Admitting: Orthopaedic Surgery

## 2021-02-22 DIAGNOSIS — Z23 Encounter for immunization: Secondary | ICD-10-CM | POA: Diagnosis not present

## 2021-05-02 DIAGNOSIS — E039 Hypothyroidism, unspecified: Secondary | ICD-10-CM | POA: Diagnosis not present

## 2021-05-02 DIAGNOSIS — E782 Mixed hyperlipidemia: Secondary | ICD-10-CM | POA: Diagnosis not present

## 2021-05-05 DIAGNOSIS — N289 Disorder of kidney and ureter, unspecified: Secondary | ICD-10-CM | POA: Insufficient documentation

## 2021-05-05 DIAGNOSIS — E039 Hypothyroidism, unspecified: Secondary | ICD-10-CM | POA: Diagnosis not present

## 2021-05-05 DIAGNOSIS — R944 Abnormal results of kidney function studies: Secondary | ICD-10-CM | POA: Diagnosis not present

## 2021-05-05 DIAGNOSIS — R350 Frequency of micturition: Secondary | ICD-10-CM | POA: Diagnosis not present

## 2021-05-05 DIAGNOSIS — Z0001 Encounter for general adult medical examination with abnormal findings: Secondary | ICD-10-CM | POA: Diagnosis not present

## 2021-05-05 DIAGNOSIS — M8580 Other specified disorders of bone density and structure, unspecified site: Secondary | ICD-10-CM | POA: Diagnosis not present

## 2021-05-05 DIAGNOSIS — E782 Mixed hyperlipidemia: Secondary | ICD-10-CM | POA: Diagnosis not present

## 2021-05-05 DIAGNOSIS — E559 Vitamin D deficiency, unspecified: Secondary | ICD-10-CM | POA: Diagnosis not present

## 2021-05-05 DIAGNOSIS — K580 Irritable bowel syndrome with diarrhea: Secondary | ICD-10-CM | POA: Diagnosis not present

## 2021-05-05 DIAGNOSIS — N3946 Mixed incontinence: Secondary | ICD-10-CM | POA: Diagnosis not present

## 2021-05-24 ENCOUNTER — Encounter: Payer: Self-pay | Admitting: Gastroenterology

## 2021-05-24 ENCOUNTER — Ambulatory Visit: Payer: Medicare PPO | Admitting: Gastroenterology

## 2021-05-24 VITALS — BP 160/84 | HR 72 | Ht 66.0 in | Wt 162.4 lb

## 2021-05-24 DIAGNOSIS — R143 Flatulence: Secondary | ICD-10-CM

## 2021-05-24 DIAGNOSIS — R194 Change in bowel habit: Secondary | ICD-10-CM

## 2021-05-24 DIAGNOSIS — R140 Abdominal distension (gaseous): Secondary | ICD-10-CM | POA: Diagnosis not present

## 2021-05-24 MED ORDER — NA SULFATE-K SULFATE-MG SULF 17.5-3.13-1.6 GM/177ML PO SOLN: 1.0000 | Freq: Once | ORAL | 0 refills | Status: AC

## 2021-05-24 NOTE — Patient Instructions (Signed)
Start Florastor probiotic daily.   Use IBGard or Peppermint Oil for bloating.  You have been scheduled for a colonoscopy. Please follow written instructions given to you at your visit today.  Please pick up your prep supplies at the pharmacy within the next 1-3 days. If you use inhalers (even only as needed), please bring them with you on the day of your procedure.  If you are age 73 or older, your body mass index should be between 23-30. Your Body mass index is 26.21 kg/m. If this is out of the aforementioned range listed, please consider follow up with your Primary Care Provider.  If you are age 10 or younger, your body mass index should be between 19-25. Your Body mass index is 26.21 kg/m. If this is out of the aformentioned range listed, please consider follow up with your Primary Care Provider.   ________________________________________________________  The [facility] providers would like to encourage you to use [facility] to communicate with providers for non-urgent requests or questions.  Due to long hold times on the telephone, sending your provider a message by [facility] may be a faster and more efficient way to get a response.  Please allow 48 business hours for a response.  Please remember that this is for non-urgent requests.  _______________________________________________________

## 2021-05-24 NOTE — Progress Notes (Signed)
05/24/2021 Sherry Reed [id number] [birth year]   HISTORY OF PRESENT ILLNESS: This is a 71 year old female who was new to our office when I saw her here in April 2022.  Her care was assigned to Dr. Henrene Pastor.  She had been referred here regarding issues with gas and fecal incontinence.  She reported of a good, but short response to Xifaxan previously so we decided to treat her with another course of that.  She was taking a probiotic, Restora.  We discussed FODMAP diet.  We also discussed taking Benefiber to help bulk the stools.  She is here today with similar complaints.  She says that mostly the gas has really limited her life.  She says that she can just be standing and just pass gas without any control.  She says that it is very embarrassing.  She says she canceled a large trip because of this.  She says that it occurs with almost anything that she eats or drinks.  She says she tried the FODMAP diet if she strictly stays with that and it seems to be better, but anytime she tries to add anything else back then it seems to cause an issue.  She uses a lot of simethicone.  She has occasional loose stools that seem to be random.  She says that she did not try the Benefiber.  She took the course of Xifaxan and once again had an amazing response, but only last for about 2 weeks.  She says that she has done pelvic floor physical therapy in the past and it did seem to help previously.  She had a colonoscopy in April 2005 at which time she had a small rectal polyp removed. That was hyperplastic on pathology. Random colon biopsies were also benign without any abnormalities. She then had another colonoscopy in May 2015 with only few scattered diverticula in the sigmoid colon. Repeat was recommended at another 10-year interval    Past Medical History:  Diagnosis Date   Arthritis    Female cystocele    grade 2   Hearing loss    bilateral   Hypothyroidism    On thyroid replacement   Observed sleep apnea     Status post UVPP   Rectocele    grade 1   Sleep apnea    SOB (shortness of breath)    Stress incontinence    Thyroid disease    Past Surgical History:  Procedure Laterality Date   THROAT SURGERY     TONSILLECTOMY      reports that she has never smoked. She has never used smokeless tobacco. She reports current alcohol use. She reports that she does not use drugs. family history includes Diabetes in her father; Heart disease in her father; Pneumonia in her mother. No Known Allergies    Outpatient Encounter Medications as of 05/24/2021  Medication Sig   Calcium-Phosphorus-Vitamin D (CALCIUM/VITAMIN D3/ADULT GUMMY PO) Take 2 tablets by mouth daily.   Cholecalciferol 1.25 MG (50000 UT) capsule cholecalciferol (vitamin D3) 1,250 mcg (50,000 unit) capsule  TAKE 1 CAPSULE(S) EVERY WEEK BY ORAL ROUTE FOR 56 DAYS.   docusate sodium (STOOL SOFTENER) 100 MG capsule Take 100 mg by mouth 2 (two) times daily.   fluticasone (FLONASE) 50 MCG/ACT nasal spray Place 1 spray into both nostrils as needed.   levothyroxine (SYNTHROID) 100 MCG tablet Take 1 tablet by mouth daily.   meloxicam (MOBIC) 7.5 MG tablet TAKE 1 TABLET(7.5 MG) BY MOUTH DAILY   mirabegron ER (MYRBETRIQ)  25 MG TB24 tablet Take 12.5 mg by mouth daily.   Multiple Vitamin (MULTIVITAMIN WITH MINERALS) TABS tablet Take 1 tablet by mouth daily.   solifenacin (VESICARE) 5 MG tablet solifenacin 5 mg tablet  TAKE 1 TABLET BY MOUTH EVERY DAY   [DISCONTINUED] levothyroxine (SYNTHROID) 112 MCG tablet Take 112 mcg by mouth daily before breakfast.   [DISCONTINUED] rifaximin (XIFAXAN) 550 MG TABS tablet Take 1 tablet (550 mg total) by mouth 3 (three) times daily.   No facility-administered encounter medications on file as of 05/24/2021.     REVIEW OF SYSTEMS  : All other systems reviewed and negative except where noted in the History of Present Illness.   PHYSICAL EXAM: BP (!) 160/84 (BP Location: Left Arm, Patient Position: Sitting, Cuff  Size: Normal)    Pulse 72    Ht 5\' 6"  (1.676 m) Comment: height measured without shoes   Wt 162 lb 6 oz (73.7 kg)    BMI 26.21 kg/m  General: Well developed white female in no acute distress Head: Normocephalic and atraumatic Eyes:  Sclerae anicteric, conjunctiva pink. Ears: Normal auditory acuity Lungs: Clear throughout to auscultation; no W/R/R. Heart: Regular rate and rhythm; no M/R/G. Abdomen: Soft, non-distended.  BS present.  Non-tender. Musculoskeletal: Symmetrical with no gross deformities  Skin: No lesions on visible extremities Extremities: No edema  Neurological: Alert oriented x 4, grossly non-focal Psychological:  Alert and cooperative. Normal mood and affect  ASSESSMENT AND PLAN: *71 year old female with primary complaint of significant uncontrollable gas, usually postprandial and intermittent episodes of diarrhea with urgency.  I saw her for this issue back in April 2022.  We treated with a course of Xifaxan as she had reported improvement with that in the past.  She says that both times she felt like was a miracle drug for about 2 weeks, but then symptoms returned.  She says that she went on the FODMAP diet and that does help, but anytime she adds anything back in to her diet then the gas returns.  She says that he is beginning to control her life as she is afraid to go anywhere and pass gas accidentally.  She canceled a major trip because of this.  She will go back on a probiotic in the form of Florastor to see if that helps.  Previously she had been on Restora.  She uses a lot of simethicone.  We gave her samples of IBgard today to try that instead.  I do not think that is worth retreating her with Xifaxan.  Last colonoscopy was 8 years ago.  She would like to be sure there is nothing else major causing this.  I think is very low yield, but we will go ahead and proceed.  Being scheduled with Dr. Henrene Pastor.  The risks, benefits, and alternatives to colonoscopy were discussed with the  patient and she consents to proceed.   We also discussed repeating pelvic floor physical therapy, but she would like to hold off for now.  CC:  Celene Squibb, MD

## 2021-05-24 NOTE — Progress Notes (Signed)
Assessment and plans reviewed  
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
## 2021-06-07 ENCOUNTER — Telehealth: Payer: Self-pay | Admitting: Gastroenterology

## 2021-06-07 NOTE — Telephone Encounter (Signed)
Inbound call from patient states suprep is not covered by insurance and need a new prescription

## 2021-06-20 NOTE — Telephone Encounter (Signed)
Patient is calling to follow up on previous request to get her prep medication. Said she is getting worried because she has not heard anything.

## 2021-06-20 NOTE — Telephone Encounter (Signed)
Spoke with patient and informed her I had a sample of Plenvu available for her and I would type up new instructions. Patient agreed with plan to change preps and will come by the office to pick up sample and instructions.

## 2021-06-21 DIAGNOSIS — L814 Other melanin hyperpigmentation: Secondary | ICD-10-CM | POA: Diagnosis not present

## 2021-06-21 DIAGNOSIS — L821 Other seborrheic keratosis: Secondary | ICD-10-CM | POA: Diagnosis not present

## 2021-06-21 DIAGNOSIS — D2272 Melanocytic nevi of left lower limb, including hip: Secondary | ICD-10-CM | POA: Diagnosis not present

## 2021-06-21 DIAGNOSIS — D225 Melanocytic nevi of trunk: Secondary | ICD-10-CM | POA: Diagnosis not present

## 2021-06-21 DIAGNOSIS — L570 Actinic keratosis: Secondary | ICD-10-CM | POA: Diagnosis not present

## 2021-06-21 NOTE — Telephone Encounter (Signed)
Pt picked up sample and instructions today.

## 2021-07-11 ENCOUNTER — Encounter: Payer: Self-pay | Admitting: Internal Medicine

## 2021-07-13 ENCOUNTER — Encounter: Payer: Medicare PPO | Admitting: Internal Medicine

## 2021-07-15 ENCOUNTER — Other Ambulatory Visit: Payer: Self-pay

## 2021-07-15 ENCOUNTER — Ambulatory Visit (AMBULATORY_SURGERY_CENTER): Payer: Medicare PPO | Admitting: Internal Medicine

## 2021-07-15 ENCOUNTER — Encounter: Payer: Self-pay | Admitting: Internal Medicine

## 2021-07-15 VITALS — BP 134/64 | HR 66 | Temp 98.7°F | Resp 12 | Ht 66.0 in | Wt 162.0 lb

## 2021-07-15 DIAGNOSIS — R143 Flatulence: Secondary | ICD-10-CM

## 2021-07-15 DIAGNOSIS — D122 Benign neoplasm of ascending colon: Secondary | ICD-10-CM

## 2021-07-15 DIAGNOSIS — R194 Change in bowel habit: Secondary | ICD-10-CM | POA: Diagnosis not present

## 2021-07-15 DIAGNOSIS — G4733 Obstructive sleep apnea (adult) (pediatric): Secondary | ICD-10-CM | POA: Diagnosis not present

## 2021-07-15 DIAGNOSIS — R140 Abdominal distension (gaseous): Secondary | ICD-10-CM

## 2021-07-15 DIAGNOSIS — E039 Hypothyroidism, unspecified: Secondary | ICD-10-CM | POA: Diagnosis not present

## 2021-07-15 HISTORY — PX: COLONOSCOPY: SHX174

## 2021-07-15 MED ORDER — SODIUM CHLORIDE 0.9 % IV SOLN: 500.0000 mL | Freq: Once | INTRAVENOUS | Status: DC

## 2021-07-15 NOTE — Progress Notes (Signed)
Called to room to assist during endoscopic procedure.  Patient ID and intended procedure confirmed with present staff. Received instructions for my participation in the procedure from the performing physician.  

## 2021-07-15 NOTE — Progress Notes (Signed)
05/24/2021 ?Sherry Reed ?[id number] ?October 13, 1950 ?  ?  ?HISTORY OF PRESENT ILLNESS: This is a 71 year old female who was new to our office when I saw her here in April 2022.  Her care was assigned to Dr. Henrene Pastor.  She had been referred here regarding issues with gas and fecal incontinence.  She reported of a good, but short response to Xifaxan previously so we decided to treat her with another course of that.  She was taking a probiotic, Restora.  We discussed FODMAP diet.  We also discussed taking Benefiber to help bulk the stools.  She is here today with similar complaints.  She says that mostly the gas has really limited her life.  She says that she can just be standing and just pass gas without any control.  She says that it is very embarrassing.  She says she canceled a large trip because of this.  She says that it occurs with almost anything that she eats or drinks.  She says she tried the FODMAP diet if she strictly stays with that and it seems to be better, but anytime she tries to add anything else back then it seems to cause an issue.  She uses a lot of simethicone.  She has occasional loose stools that seem to be random.  She says that she did not try the Benefiber.  She took the course of Xifaxan and once again had an amazing response, but only last for about 2 weeks.  She says that she has done pelvic floor physical therapy in the past and it did seem to help previously. ?  ?She had a colonoscopy in April 2005 at which time she had a small rectal polyp removed. That was hyperplastic on pathology. Random colon biopsies were also benign without any abnormalities. She then had another colonoscopy in May 2015 with only few scattered diverticula in the sigmoid colon. Repeat was recommended at another 10-year interval  ?  ?  ?    ?Past Medical History:  ?Diagnosis Date  ? Arthritis    ? Female cystocele    ?  grade 2  ? Hearing loss    ?  bilateral  ? Hypothyroidism    ?  On thyroid replacement  ? Observed  sleep apnea    ?  Status post UVPP  ? Rectocele    ?  grade 1  ? Sleep apnea    ? SOB (shortness of breath)    ? Stress incontinence    ? Thyroid disease    ?  ?     ?Past Surgical History:  ?Procedure Laterality Date  ? THROAT SURGERY      ? TONSILLECTOMY      ?  ? reports that she has never smoked. She has never used smokeless tobacco. She reports current alcohol use. She reports that she does not use drugs. ?family history includes Diabetes in her father; Heart disease in her father; Pneumonia in her mother. ?No Known Allergies ?  ?  ?    ?Outpatient Encounter Medications as of 05/24/2021  ?Medication Sig  ? Calcium-Phosphorus-Vitamin D (CALCIUM/VITAMIN D3/ADULT GUMMY PO) Take 2 tablets by mouth daily.  ? Cholecalciferol 1.25 MG (50000 UT) capsule cholecalciferol (vitamin D3) 1,250 mcg (50,000 unit) capsule ? TAKE 1 CAPSULE(S) EVERY WEEK BY ORAL ROUTE FOR 56 DAYS.  ? docusate sodium (STOOL SOFTENER) 100 MG capsule Take 100 mg by mouth 2 (two) times daily.  ? fluticasone (FLONASE) 50 MCG/ACT nasal spray Place 1 spray  into both nostrils as needed.  ? levothyroxine (SYNTHROID) 100 MCG tablet Take 1 tablet by mouth daily.  ? meloxicam (MOBIC) 7.5 MG tablet TAKE 1 TABLET(7.5 MG) BY MOUTH DAILY  ? mirabegron ER (MYRBETRIQ) 25 MG TB24 tablet Take 12.5 mg by mouth daily.  ? Multiple Vitamin (MULTIVITAMIN WITH MINERALS) TABS tablet Take 1 tablet by mouth daily.  ? solifenacin (VESICARE) 5 MG tablet solifenacin 5 mg tablet ? TAKE 1 TABLET BY MOUTH EVERY DAY  ? [DISCONTINUED] levothyroxine (SYNTHROID) 112 MCG tablet Take 112 mcg by mouth daily before breakfast.  ? [DISCONTINUED] rifaximin (XIFAXAN) 550 MG TABS tablet Take 1 tablet (550 mg total) by mouth 3 (three) times daily.  ?  ?No facility-administered encounter medications on file as of 05/24/2021.  ?  ?  ?  ?REVIEW OF SYSTEMS  : All other systems reviewed and negative except where noted in the History of Present Illness. ?  ?  ?PHYSICAL EXAM: ?BP (!) 160/84 (BP  Location: Left Arm, Patient Position: Sitting, Cuff Size: Normal)   Pulse 72   Ht 5\' 6"  (1.676 m) Comment: height measured without shoes  Wt 162 lb 6 oz (73.7 kg)   BMI 26.21 kg/m?  ?General: Well developed white female in no acute distress ?Head: Normocephalic and atraumatic ?Eyes:  Sclerae anicteric, conjunctiva pink. ?Ears: Normal auditory acuity ?Lungs: Clear throughout to auscultation; no W/R/R. ?Heart: Regular rate and rhythm; no M/R/G. ?Abdomen: Soft, non-distended.  BS present.  Non-tender. ?Musculoskeletal: Symmetrical with no gross deformities  ?Skin: No lesions on visible extremities ?Extremities: No edema  ?Neurological: Alert oriented x 4, grossly non-focal ?Psychological:  Alert and cooperative. Normal mood and affect ?  ?ASSESSMENT AND PLAN: ?*71 year old female with primary complaint of significant uncontrollable gas, usually postprandial and intermittent episodes of diarrhea with urgency.  I saw her for this issue back in April 2022.  We treated with a course of Xifaxan as she had reported improvement with that in the past.  She says that both times she felt like was a miracle drug for about 2 weeks, but then symptoms returned.  She says that she went on the FODMAP diet and that does help, but anytime she adds anything back in to her diet then the gas returns.  She says that he is beginning to control her life as she is afraid to go anywhere and pass gas accidentally.  She canceled a major trip because of this.  She will go back on a probiotic in the form of Florastor to see if that helps.  Previously she had been on Restora.  She uses a lot of simethicone.  We gave her samples of IBgard today to try that instead.  I do not think that is worth retreating her with Xifaxan.  Last colonoscopy was 8 years ago.  She would like to be sure there is nothing else major causing this.  I think is very low yield, but we will go ahead and proceed.  Being scheduled with Dr. Henrene Pastor.  The risks, benefits, and  alternatives to colonoscopy were discussed with the patient and she consents to proceed.   We also discussed repeating pelvic floor physical therapy, but she would like to hold off for now. ? ?Patient was seen in the office May 24, 2021.  That complete H&P as outlined above.  No interval changes.  She is now for colonoscopy ?

## 2021-07-15 NOTE — Patient Instructions (Addendum)
Handouts given for Gas, anti-gas diet, lactose intolerance, polyps, and diverticulosis. ? ?YOU HAD AN ENDOSCOPIC PROCEDURE TODAY AT [facility]:   Refer to the procedure report that was given to you for any specific questions about what was found during the examination.  If the procedure report does not answer your questions, please call your gastroenterologist to clarify.  If you requested that your care partner not be given the details of your procedure findings, then the procedure report has been included in a sealed envelope for you to review at your convenience later. ? ?YOU SHOULD EXPECT: Some feelings of bloating in the abdomen. Passage of more gas than usual.  Walking can help get rid of the air that was put into your GI tract during the procedure and reduce the bloating. If you had a lower endoscopy (such as a colonoscopy or flexible sigmoidoscopy) you may notice spotting of blood in your stool or on the toilet paper. If you underwent a bowel prep for your procedure, you may not have a normal bowel movement for a few days. ? ?Please Note:  You might notice some irritation and congestion in your nose or some drainage.  This is from the oxygen used during your procedure.  There is no need for concern and it should clear up in a day or so. ? ?SYMPTOMS TO REPORT IMMEDIATELY: ? ?Following lower endoscopy (colonoscopy): ? Excessive amounts of blood in the stool ? Significant tenderness or worsening of abdominal pains ? Swelling of the abdomen that is new, acute ? Fever of 100?F or higher ? ?For urgent or emergent issues, a gastroenterologist can be reached at any hour by calling [phone]. ?Do not use MyChart messaging for urgent concerns.  ? ? ?DIET:  We do recommend a small meal at first, but then you may proceed to your regular diet.  Drink plenty of fluids but you should avoid alcoholic beverages for 24 hours. ? ?ACTIVITY:  You should plan to take it easy for the rest of today and you  should NOT DRIVE or use heavy machinery until tomorrow (because of the sedation medicines used during the test).   ? ?FOLLOW UP: ?Our staff will call the number listed on your records 48-72 hours following your procedure to check on you and address any questions or concerns that you may have regarding the information given to you following your procedure. If we do not reach you, we will leave a message.  We will attempt to reach you two times.  During this call, we will ask if you have developed any symptoms of COVID 19. If you develop any symptoms (ie: fever, flu-like symptoms, shortness of breath, cough etc.) before then, please call [phone].  If you test positive for Covid 19 in the 2 weeks post procedure, please call and report this information to [place].   ? ?If any biopsies were taken you will be contacted by phone or by letter within the next 1-3 weeks.  Please call us at [phone] if you have not heard about the biopsies in 3 weeks.  ? ? ?SIGNATURES/CONFIDENTIALITY: ?You and/or your care partner have signed paperwork which will be entered into your electronic medical record.  These signatures attest to the fact that that the information above on your After Visit Summary has been reviewed and is understood.  Full responsibility of the confidentiality of this discharge information lies with you and/or your care-partner.  ?

## 2021-07-15 NOTE — Progress Notes (Signed)
VS  DT ? ?Pt's states no medical or surgical changes since previsit or office visit. ? ?

## 2021-07-15 NOTE — Op Note (Signed)
[facility] ?Patient Name: Sherry Reed ?Procedure Date: 07/15/2021 3:25 PM ?MRN: [id number] ?Endoscopist: Docia Chuck. Henrene Pastor , MD ?Age: 71 ?Referring MD:  ?Date of Birth: June 22, 1950 ?Gender: Female ?Account #: [id number] ?Procedure:                Colonoscopy with cold snare polypectomy x 1; with  ?                          biopsies ?Indications:              Change in bowel habits, bloating, increased gas.  ?                          Previous colonoscopy in 2005 and 2015 with Dr. Collene Mares  ?                          were both negative for neoplasia ?Medicines:                Monitored Anesthesia Care ?Procedure:                Pre-Anesthesia Assessment: ?                          - Prior to the procedure, a History and Physical  ?                          was performed, and patient medications and  ?                          allergies were reviewed. The patient's tolerance of  ?                          previous anesthesia was also reviewed. The risks  ?                          and benefits of the procedure and the sedation  ?                          options and risks were discussed with the patient.  ?                          All questions were answered, and informed consent  ?                          was obtained. Prior Anticoagulants: The patient has  ?                          taken no previous anticoagulant or antiplatelet  ?                          agents. ASA Grade Assessment: II - A patient with  ?                          mild systemic disease. After reviewing the risks  ?  and benefits, the patient was deemed in  ?                          satisfactory condition to undergo the procedure. ?                          After obtaining informed consent, the colonoscope  ?                          was passed under direct vision. Throughout the  ?                          procedure, the patient's blood pressure, pulse, and  ?                          oxygen saturations were  monitored continuously. The  ?                          Colonoscope was introduced through the anus and  ?                          advanced to the the cecum, identified by  ?                          appendiceal orifice and ileocecal valve. The  ?                          ileocecal valve, appendiceal orifice, and rectum  ?                          were photographed. The quality of the bowel  ?                          preparation was excellent. The colonoscopy was  ?                          performed without difficulty. The patient tolerated  ?                          the procedure well. The bowel preparation used was  ?                          SUPREP via split dose instruction. ?Scope In: 3:43:07 PM ?Scope Out: 3:59:24 PM ?Scope Withdrawal Time: 0 hours 11 minutes 10 seconds  ?Total Procedure Duration: 0 hours 16 minutes 17 seconds  ?Findings:                 A 2 mm polyp was found in the ascending colon. The  ?                          polyp was removed with a cold snare. Resection and  ?                          retrieval were complete. ?  Multiple diverticula were found in the sigmoid  ?                          colon. ?                          The entire examined colon appeared otherwise normal  ?                          on direct and retroflexion views. Biopsies for  ?                          histology were taken with a cold forceps from the  ?                          left colon for evaluation of microscopic colitis. ?Complications:            No immediate complications. Estimated blood loss:  ?                          None. ?Estimated Blood Loss:     Estimated blood loss: none. ?Impression:               - One 2 mm polyp in the ascending colon, removed  ?                          with a cold snare. Resected and retrieved. ?                          - Diverticulosis in the sigmoid colon. ?                          - The entire examined colon is otherwise normal on  ?                           direct and retroflexion views. ?Recommendation:           - Repeat colonoscopy is not recommended for  ?                          surveillance. ?                          - Patient has a contact number available for  ?                          emergencies. The signs and symptoms of potential  ?                          delayed complications were discussed with the  ?                          patient. Return to normal activities tomorrow.  ?                          Written discharge instructions were provided to  the  ?                          patient. ?                          - Resume previous diet. ?                          - Continue present medications. ?                          - Await pathology results. ?                          - RECOMMEND CITRUCEL POWDER. Take 2 tablespoons  ?                          daily in 12 to 14 ounces of water or juice ?                          - Please provide the patient with literature on  ?                          intestinal gas. Also provide her with an anti-gas  ?                          dietary sheet ?Docia Chuck. Henrene Pastor, MD ?07/15/2021 4:07:24 PM ?This report has been signed electronically. ?

## 2021-07-15 NOTE — Progress Notes (Signed)
Sedate, gd SR, tolerated procedure well, VSS, report to RN 

## 2021-07-19 ENCOUNTER — Telehealth: Payer: Self-pay

## 2021-07-19 ENCOUNTER — Telehealth: Payer: Self-pay | Admitting: *Deleted

## 2021-07-19 NOTE — Telephone Encounter (Signed)
?  Follow up Call- ? ?Call back number 07/15/2021  ?Post procedure Call Back phone  # [phone]  ?Permission to leave phone message Yes  ?Some recent data might be hidden  ?  ? ?Patient questions: ? ?Do you have a fever, pain , or abdominal swelling? No. ?Pain Score  0 * ? ?Have you tolerated food without any problems? Yes.   ? ?Have you been able to return to your normal activities? Yes.   ? ?Do you have any questions about your discharge instructions: ?Diet   No. ?Medications  No. ?Follow up visit  No. ? ?Do you have questions or concerns about your Care? No. ? ?Actions: ?* If pain score is 4 or above: ?No action needed, pain <4. ? ? ?

## 2021-07-19 NOTE — Telephone Encounter (Signed)
Attempted f/u phone call. No answer. Left message. °

## 2021-07-20 ENCOUNTER — Encounter: Payer: Self-pay | Admitting: Internal Medicine

## 2021-08-04 DIAGNOSIS — B079 Viral wart, unspecified: Secondary | ICD-10-CM | POA: Diagnosis not present

## 2021-08-04 DIAGNOSIS — Q6632 Other congenital varus deformities of feet, left foot: Secondary | ICD-10-CM | POA: Diagnosis not present

## 2021-08-04 DIAGNOSIS — M79672 Pain in left foot: Secondary | ICD-10-CM | POA: Diagnosis not present

## 2021-08-18 DIAGNOSIS — B079 Viral wart, unspecified: Secondary | ICD-10-CM | POA: Diagnosis not present

## 2021-09-12 DIAGNOSIS — Z01419 Encounter for gynecological examination (general) (routine) without abnormal findings: Secondary | ICD-10-CM | POA: Diagnosis not present

## 2021-09-12 DIAGNOSIS — Z1231 Encounter for screening mammogram for malignant neoplasm of breast: Secondary | ICD-10-CM | POA: Diagnosis not present

## 2021-09-12 DIAGNOSIS — Z6826 Body mass index (BMI) 26.0-26.9, adult: Secondary | ICD-10-CM | POA: Diagnosis not present

## 2021-09-12 DIAGNOSIS — M8580 Other specified disorders of bone density and structure, unspecified site: Secondary | ICD-10-CM | POA: Diagnosis not present

## 2021-09-12 DIAGNOSIS — N898 Other specified noninflammatory disorders of vagina: Secondary | ICD-10-CM | POA: Diagnosis not present

## 2021-09-12 DIAGNOSIS — N819 Female genital prolapse, unspecified: Secondary | ICD-10-CM | POA: Diagnosis not present

## 2021-09-12 DIAGNOSIS — Z1211 Encounter for screening for malignant neoplasm of colon: Secondary | ICD-10-CM | POA: Diagnosis not present

## 2021-09-13 ENCOUNTER — Other Ambulatory Visit: Payer: Self-pay | Admitting: Obstetrics and Gynecology

## 2021-09-13 DIAGNOSIS — M8580 Other specified disorders of bone density and structure, unspecified site: Secondary | ICD-10-CM

## 2021-09-15 DIAGNOSIS — L570 Actinic keratosis: Secondary | ICD-10-CM | POA: Diagnosis not present

## 2021-09-29 DIAGNOSIS — N8111 Cystocele, midline: Secondary | ICD-10-CM | POA: Diagnosis not present

## 2021-09-29 DIAGNOSIS — N3941 Urge incontinence: Secondary | ICD-10-CM | POA: Diagnosis not present

## 2021-10-20 DIAGNOSIS — L568 Other specified acute skin changes due to ultraviolet radiation: Secondary | ICD-10-CM | POA: Diagnosis not present

## 2021-10-27 ENCOUNTER — Ambulatory Visit
Admission: RE | Admit: 2021-10-27 | Discharge: 2021-10-27 | Disposition: A | Discharge status: Home / Self Care | Payer: Medicare PPO | Source: Ambulatory Visit | Attending: Obstetrics and Gynecology | Admitting: Obstetrics and Gynecology

## 2021-10-27 DIAGNOSIS — Z780 Asymptomatic menopausal state: Secondary | ICD-10-CM | POA: Diagnosis not present

## 2021-10-27 DIAGNOSIS — E039 Hypothyroidism, unspecified: Secondary | ICD-10-CM | POA: Diagnosis not present

## 2021-10-27 DIAGNOSIS — M8580 Other specified disorders of bone density and structure, unspecified site: Secondary | ICD-10-CM

## 2021-10-27 DIAGNOSIS — E782 Mixed hyperlipidemia: Secondary | ICD-10-CM | POA: Diagnosis not present

## 2021-10-27 DIAGNOSIS — M8589 Other specified disorders of bone density and structure, multiple sites: Secondary | ICD-10-CM | POA: Diagnosis not present

## 2021-11-02 DIAGNOSIS — E559 Vitamin D deficiency, unspecified: Secondary | ICD-10-CM | POA: Diagnosis not present

## 2021-11-02 DIAGNOSIS — E663 Overweight: Secondary | ICD-10-CM | POA: Diagnosis not present

## 2021-11-02 DIAGNOSIS — E782 Mixed hyperlipidemia: Secondary | ICD-10-CM | POA: Diagnosis not present

## 2021-11-02 DIAGNOSIS — R350 Frequency of micturition: Secondary | ICD-10-CM | POA: Diagnosis not present

## 2021-11-02 DIAGNOSIS — R944 Abnormal results of kidney function studies: Secondary | ICD-10-CM | POA: Diagnosis not present

## 2021-11-02 DIAGNOSIS — N3946 Mixed incontinence: Secondary | ICD-10-CM | POA: Diagnosis not present

## 2021-11-02 DIAGNOSIS — K580 Irritable bowel syndrome with diarrhea: Secondary | ICD-10-CM | POA: Diagnosis not present

## 2021-11-02 DIAGNOSIS — M8580 Other specified disorders of bone density and structure, unspecified site: Secondary | ICD-10-CM | POA: Diagnosis not present

## 2021-11-02 DIAGNOSIS — E039 Hypothyroidism, unspecified: Secondary | ICD-10-CM | POA: Diagnosis not present

## 2021-11-21 ENCOUNTER — Other Ambulatory Visit: Payer: Self-pay | Admitting: Orthopaedic Surgery

## 2021-12-13 DIAGNOSIS — N390 Urinary tract infection, site not specified: Secondary | ICD-10-CM | POA: Diagnosis not present

## 2021-12-13 DIAGNOSIS — U071 COVID-19: Secondary | ICD-10-CM | POA: Diagnosis not present

## 2021-12-13 DIAGNOSIS — J101 Influenza due to other identified influenza virus with other respiratory manifestations: Secondary | ICD-10-CM | POA: Diagnosis not present

## 2021-12-13 DIAGNOSIS — Z20822 Contact with and (suspected) exposure to covid-19: Secondary | ICD-10-CM | POA: Diagnosis not present

## 2021-12-14 DIAGNOSIS — N390 Urinary tract infection, site not specified: Secondary | ICD-10-CM | POA: Diagnosis not present

## 2021-12-16 DIAGNOSIS — J101 Influenza due to other identified influenza virus with other respiratory manifestations: Secondary | ICD-10-CM | POA: Diagnosis not present

## 2021-12-16 DIAGNOSIS — U071 COVID-19: Secondary | ICD-10-CM | POA: Diagnosis not present

## 2022-02-13 DIAGNOSIS — L568 Other specified acute skin changes due to ultraviolet radiation: Secondary | ICD-10-CM | POA: Diagnosis not present

## 2022-02-13 DIAGNOSIS — B001 Herpesviral vesicular dermatitis: Secondary | ICD-10-CM | POA: Diagnosis not present

## 2022-03-09 DIAGNOSIS — H6121 Impacted cerumen, right ear: Secondary | ICD-10-CM | POA: Diagnosis not present

## 2022-03-09 DIAGNOSIS — L608 Other nail disorders: Secondary | ICD-10-CM | POA: Diagnosis not present

## 2022-03-14 DIAGNOSIS — L568 Other specified acute skin changes due to ultraviolet radiation: Secondary | ICD-10-CM | POA: Diagnosis not present

## 2022-04-04 DIAGNOSIS — H903 Sensorineural hearing loss, bilateral: Secondary | ICD-10-CM | POA: Diagnosis not present

## 2022-04-04 DIAGNOSIS — Z974 Presence of external hearing-aid: Secondary | ICD-10-CM | POA: Diagnosis not present

## 2022-04-27 DIAGNOSIS — E782 Mixed hyperlipidemia: Secondary | ICD-10-CM | POA: Diagnosis not present

## 2022-04-27 DIAGNOSIS — E039 Hypothyroidism, unspecified: Secondary | ICD-10-CM | POA: Diagnosis not present

## 2022-05-04 DIAGNOSIS — K580 Irritable bowel syndrome with diarrhea: Secondary | ICD-10-CM | POA: Diagnosis not present

## 2022-05-04 DIAGNOSIS — Z0000 Encounter for general adult medical examination without abnormal findings: Secondary | ICD-10-CM | POA: Diagnosis not present

## 2022-05-04 DIAGNOSIS — E039 Hypothyroidism, unspecified: Secondary | ICD-10-CM | POA: Diagnosis not present

## 2022-05-04 DIAGNOSIS — R350 Frequency of micturition: Secondary | ICD-10-CM | POA: Diagnosis not present

## 2022-05-04 DIAGNOSIS — Z23 Encounter for immunization: Secondary | ICD-10-CM | POA: Diagnosis not present

## 2022-05-04 DIAGNOSIS — M8580 Other specified disorders of bone density and structure, unspecified site: Secondary | ICD-10-CM | POA: Diagnosis not present

## 2022-05-04 DIAGNOSIS — E782 Mixed hyperlipidemia: Secondary | ICD-10-CM | POA: Diagnosis not present

## 2022-05-04 DIAGNOSIS — E559 Vitamin D deficiency, unspecified: Secondary | ICD-10-CM | POA: Diagnosis not present

## 2022-05-04 DIAGNOSIS — N3946 Mixed incontinence: Secondary | ICD-10-CM | POA: Diagnosis not present

## 2022-05-21 ENCOUNTER — Other Ambulatory Visit: Payer: Self-pay | Admitting: Orthopaedic Surgery

## 2022-05-29 DIAGNOSIS — L814 Other melanin hyperpigmentation: Secondary | ICD-10-CM | POA: Diagnosis not present

## 2022-05-29 DIAGNOSIS — D225 Melanocytic nevi of trunk: Secondary | ICD-10-CM | POA: Diagnosis not present

## 2022-05-29 DIAGNOSIS — L821 Other seborrheic keratosis: Secondary | ICD-10-CM | POA: Diagnosis not present

## 2022-05-29 DIAGNOSIS — L568 Other specified acute skin changes due to ultraviolet radiation: Secondary | ICD-10-CM | POA: Diagnosis not present

## 2022-06-01 ENCOUNTER — Encounter: Payer: Medicare PPO | Attending: Internal Medicine | Admitting: Nutrition

## 2022-06-01 VITALS — Ht 65.0 in | Wt 166.0 lb

## 2022-06-01 DIAGNOSIS — K582 Mixed irritable bowel syndrome: Secondary | ICD-10-CM

## 2022-06-01 NOTE — Patient Instructions (Signed)
Goals  Follow Fodmap for 10 days Keep food journal and document symptoms and GI issues Drink only water Slowly increase fiber foods back into diet as tolerated. Talk to pharmacist about clinical strength probiotic. Talk to GI Md about stool softeners/gas x Walk 30 minutes a day Drink 64 oz of water per day

## 2022-06-01 NOTE — Progress Notes (Signed)
Medical Nutrition Therapy  Appointment Start time:  0800  Appointment End time:  0845  Primary concerns today: IBS  Referral diagnosis: K58.2 Preferred learning style: NO preference  Learning readiness: Ready )   NUTRITION ASSESSMENT  72 yr old wfemale here for problems related to IBS. She notes she saw a dietitian in the past but didn't feel she was very helpful. She has hearing issues. She notes she has had chronic issues with poor bowel habits, lots of gas, abdominal pain. She notes she tried following the FODMAP diet in the past but wasn't able to stick to it very consistently. Her bowel regimen and gas limits her from being socially active with friends or going out to eat. Gained 4 lbs. She tried using Xifaxan and found it helpful. But as she started eating certain foods, the symptoms came back. Is followed by GI,  Dr.Sandra Rivard. PCP Dr. Juel Burrow office.  She also has bladder issues that are triggered by lemon and anythign citrus.  She is willing to re try the FODMAP diet and see if she can gain control of some of her GI issues. Stressed importance of consistency and strictly following it for best results.  Eating habits are random and inconstent depending on her GI issues.  Anthropometrics  Wt Readings from Last 3 Encounters:  06/01/22 166 lb (75.3 kg)  07/15/21 162 lb (73.5 kg)  05/24/21 162 lb 6 oz (73.7 kg)   Ht Readings from Last 3 Encounters:  06/01/22 '5\' 5"'$  (1.651 m)  07/15/21 '5\' 6"'$  (1.676 m)  05/24/21 '5\' 6"'$  (1.676 m)   Body mass index is 27.62 kg/m. '@BMIFA'$ @ Facility age limit for growth %iles is 20 years. Facility age limit for growth %iles is 20 years.Sherry Reed   Clinical Medical Hx: IBS. See chart Medications: See chart Labs:   Notable Signs/Symptoms: Gas, diarrhea, constipation, abdominal pain, bloating  Lifestyle & Dietary Hx Married and lives with her husband.  Estimated daily fluid intake: 40 oz Supplements: MVI, Calcium Sleep: varies Stress /  self-care: stress of her GI issues Current average weekly physical activity: walks som  24-Hr Dietary Recall Eats 1-2 times per day depending on how she is feeling. Sometimes she wont' eat so she doesn't have to go to the bathroom when in public or with friends. Does eat some foods she knows she shouldn't at thimes.   Estimated Energy Needs Calories: 1500-1800 Carbohydrate: 170g Protein: 112g Fat: 42g   NUTRITION DIAGNOSIS  Garrett-1.4 Altered GI function As related to GI function and motility issues IBS.  As evidenced by diarrhea, constipation, bloating and abdominal pain when eating certain foods..  NUTRITION INTERVENTION  Nutrition education (E-1) on the following topics:  FODMAP Diet. IBS  Handouts Provided Include  IBS and FODMAP Diet  Learning Style & Readiness for Change Teaching method utilized: Visual & Auditory  Demonstrated degree of understanding via: Teach Back  Barriers to learning/adherence to lifestyle change: Fear of GI Issues.  Goals Established by Pt Goals  Follow Fodmap for 10 days Keep food journal and document symptoms and GI issues Drink only water Slowly increase fiber foods back into diet as tolerated. Talk to pharmacist about clinical strength probiotic. Talk to GI Md about stool softeners/gas x Walk 30 minutes a day Drink 64 oz of water per day   MONITORING & EVALUATION Dietary intake, weekly physical activity, and GI symptoms. PRN Next Steps  Patient is to work on sticking to [employer] strictly. Marland Kitchen

## 2022-06-05 ENCOUNTER — Other Ambulatory Visit (HOSPITAL_COMMUNITY): Payer: Self-pay | Admitting: Family Medicine

## 2022-06-05 ENCOUNTER — Ambulatory Visit (HOSPITAL_COMMUNITY)
Admission: RE | Admit: 2022-06-05 | Discharge: 2022-06-05 | Disposition: A | Discharge status: Home / Self Care | Payer: Medicare PPO | Source: Ambulatory Visit | Attending: Family Medicine | Admitting: Family Medicine

## 2022-06-05 DIAGNOSIS — M549 Dorsalgia, unspecified: Secondary | ICD-10-CM | POA: Diagnosis not present

## 2022-06-28 DIAGNOSIS — H9203 Otalgia, bilateral: Secondary | ICD-10-CM | POA: Diagnosis not present

## 2022-06-28 DIAGNOSIS — H903 Sensorineural hearing loss, bilateral: Secondary | ICD-10-CM | POA: Diagnosis not present

## 2022-07-11 ENCOUNTER — Encounter: Payer: Self-pay | Admitting: Nutrition

## 2022-08-25 DIAGNOSIS — W57XXXA Bitten or stung by nonvenomous insect and other nonvenomous arthropods, initial encounter: Secondary | ICD-10-CM | POA: Diagnosis not present

## 2022-08-25 DIAGNOSIS — J309 Allergic rhinitis, unspecified: Secondary | ICD-10-CM | POA: Diagnosis not present

## 2022-09-01 ENCOUNTER — Other Ambulatory Visit: Payer: Self-pay | Admitting: Orthopaedic Surgery

## 2022-09-05 ENCOUNTER — Telehealth: Payer: Self-pay | Admitting: Orthopaedic Surgery

## 2022-09-05 NOTE — Telephone Encounter (Signed)
Called pt 1X and left vm. Dr Blackman has an emergency and will not be in office tomorrow ans pt need to reschedule for next available appt with Dr Blackman or PA Clark. Pt choice. 

## 2022-09-06 ENCOUNTER — Ambulatory Visit: Payer: Medicare PPO | Admitting: Orthopaedic Surgery

## 2022-09-18 DIAGNOSIS — Z6826 Body mass index (BMI) 26.0-26.9, adult: Secondary | ICD-10-CM | POA: Diagnosis not present

## 2022-09-18 DIAGNOSIS — Z01411 Encounter for gynecological examination (general) (routine) with abnormal findings: Secondary | ICD-10-CM | POA: Diagnosis not present

## 2022-09-18 DIAGNOSIS — Z1231 Encounter for screening mammogram for malignant neoplasm of breast: Secondary | ICD-10-CM | POA: Diagnosis not present

## 2022-09-18 DIAGNOSIS — Z1211 Encounter for screening for malignant neoplasm of colon: Secondary | ICD-10-CM | POA: Diagnosis not present

## 2022-09-18 DIAGNOSIS — M8580 Other specified disorders of bone density and structure, unspecified site: Secondary | ICD-10-CM | POA: Diagnosis not present

## 2022-09-18 DIAGNOSIS — N898 Other specified noninflammatory disorders of vagina: Secondary | ICD-10-CM | POA: Diagnosis not present

## 2022-09-18 DIAGNOSIS — N819 Female genital prolapse, unspecified: Secondary | ICD-10-CM | POA: Diagnosis not present

## 2022-09-19 ENCOUNTER — Ambulatory Visit
Admission: EM | Admit: 2022-09-19 | Discharge: 2022-09-19 | Disposition: A | Discharge status: Home / Self Care | Payer: Medicare PPO | Attending: Nurse Practitioner | Admitting: Nurse Practitioner

## 2022-09-19 ENCOUNTER — Encounter: Payer: Self-pay | Admitting: Emergency Medicine

## 2022-09-19 DIAGNOSIS — Z1152 Encounter for screening for COVID-19: Secondary | ICD-10-CM | POA: Insufficient documentation

## 2022-09-19 DIAGNOSIS — J029 Acute pharyngitis, unspecified: Secondary | ICD-10-CM | POA: Diagnosis not present

## 2022-09-19 DIAGNOSIS — Z1231 Encounter for screening mammogram for malignant neoplasm of breast: Secondary | ICD-10-CM | POA: Diagnosis not present

## 2022-09-19 NOTE — ED Triage Notes (Signed)
Cough, runny nose that started 2 weeks ago.  Chills yesterday with fatigue, woke up sore throat this morning and headache.  Headache comes and goes.

## 2022-09-19 NOTE — ED Provider Notes (Signed)
[facility]    CSN: [id number] Arrival date & time: 09/19/22  1346      History   Chief Complaint No chief complaint on file.   HPI Sherry Reed is a 72 y.o. female.   Patient presents today with 1 day history of sore throat, headache on the top of her head, decreased appetite, and fatigue.  She reports she has had viral symptoms on and off for the past couple of weeks, was feeling fully well yesterday, then symptoms recurred again this morning.  She denies fever, body aches or chills, cough, shortness of breath or chest pain, runny or stuffy nose, ear pain, abdominal pain, nausea/vomiting since the symptoms recurred this morning.  Has taken Aleve which did seem to help with the sore throat a little bit.  Reports she does not eat anything besides M&Ms today.    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      Home Medications    Prior to Admission medications   Medication Sig Start Date End Date Taking? Authorizing Provider  Calcium-Phosphorus-Vitamin D (CALCIUM/VITAMIN D3/ADULT GUMMY PO) Take 2 tablets by mouth daily.    [provider]   Cholecalciferol 1.25 MG (50000 UT) capsule cholecalciferol (vitamin D3) 1,250 mcg (50,000 unit) capsule  TAKE 1 CAPSULE(S) EVERY WEEK BY ORAL ROUTE FOR 56 DAYS.    [provider]  docusate sodium (COLACE) 100 MG capsule Take 100 mg by mouth 2 (two) times daily.    [provider]  fluticasone (FLONASE) 50 MCG/ACT nasal spray Place 1 spray into both nostrils as needed. 07/17/13   [provider]  levothyroxine (SYNTHROID) 100 MCG tablet Take 1 tablet by mouth daily.    [provider]  meloxicam (MOBIC) 7.5 MG tablet TAKE 1 TABLET(7.5 MG) BY MOUTH DAILY 05/22/22   Kathryne Hitch, MD  mirabegron ER (MYRBETRIQ) 25 MG TB24 tablet Take 12.5 mg by mouth daily.    [provider]  Multiple Vitamin (MULTIVITAMIN WITH MINERALS) TABS tablet Take 1 tablet by mouth daily.    [provider]  solifenacin (VESICARE) 5 MG tablet solifenacin 5 mg tablet  TAKE 1 TABLET BY MOUTH EVERY DAY    [provider]    Family History Family History  Problem Relation Age of Onset   Pneumonia Mother        died of this   Diabetes Father    Heart disease Father     Social History  Social History   Tobacco Use   Smoking status: Never   Smokeless tobacco: Never  Substance Use Topics   Alcohol use: Yes    Comment: rare   Drug use: No     Allergies   Patient has no known allergies.   Review of Systems Review of Systems Per HPI  Physical Exam Triage Vital Signs ED Triage Vitals [09/19/22 1352]  Enc Vitals Group     BP 127/74     Pulse Rate 86     Resp 16     Temp 98.2 F (36.8 C)     Temp Source Oral     SpO2 95 %     Weight      Height      Head Circumference      Peak Flow      Pain Score 3     Pain Loc      Pain Edu?      Excl. in GC?    No data found.  Updated Vital Signs BP 127/74 (BP Location: Right Arm)   Pulse 86   Temp 98.2 F (36.8 C) (Oral)   Resp 16   SpO2 95%   Visual Acuity Right Eye Distance:    Left Eye Distance:   Bilateral Distance:    Right Eye Near:   Left Eye Near:    Bilateral Near:     Physical Exam Vitals and nursing note reviewed.  Constitutional:      General: She is not in acute distress.    Appearance: Normal appearance. She is not ill-appearing or toxic-appearing.  HENT:     Head: Normocephalic and atraumatic.     Right Ear: Ear canal and external ear normal. A middle ear effusion is present. Tympanic membrane is not erythematous.     Left Ear: Ear canal and external ear normal. A middle ear effusion is present. Tympanic membrane is not erythematous.     Nose: Rhinorrhea present. No congestion.     Comments: Post nasal drainage    Mouth/Throat:     Mouth: Mucous membranes are moist.     Pharynx: Oropharynx is clear. No oropharyngeal exudate or posterior oropharyngeal erythema.     Comments: Post nasal drainage Eyes:     General: No scleral icterus.    Extraocular Movements: Extraocular movements intact.  Cardiovascular:     Rate and Rhythm: Normal rate and regular rhythm.  Pulmonary:     Effort: Pulmonary effort is normal. No respiratory distress.     Breath sounds: Normal breath sounds. No wheezing, rhonchi or rales.  Abdominal:     General: Abdomen is flat. Bowel sounds are normal. There is no distension.     Palpations: Abdomen is soft.  Musculoskeletal:     Cervical back: Normal range of motion and neck supple.  Lymphadenopathy:     Cervical: No cervical adenopathy.  Skin:    General: Skin is warm and dry.     Coloration: Skin is not jaundiced or pale.     Findings: No erythema or rash.  Neurological:     Mental Status: She is alert and oriented to person, place, and time.  Psychiatric:        Behavior: Behavior is cooperative.      UC Treatments / Results  Labs (all labs ordered are listed, but only abnormal results are displayed) Labs Reviewed  SARS CORONAVIRUS 2 (TAT 6-24 HRS)    EKG   Radiology No results  found.  Procedures Procedures (  including critical care time)  Medications Ordered in UC Medications - No data to display  Initial Impression / Assessment and Plan / UC Course  I have reviewed the triage vital signs and the nursing notes.  Pertinent labs & imaging results that were available during my care of the patient were reviewed by me and considered in my medical decision making (see chart for details).   Patient is well-appearing, normotensive, afebrile, not tachycardic, not tachypneic, oxygenating well on room air.    1. Acute pharyngitis, unspecified etiology 2. Encounter for screening for COVID-19 Rapid strep throat test today is negative Centor score today is 0 Throat culture deferred Recommended Flonase step with postnasal drainage, bilateral ear effusions COVID-19 test is pending Supportive care discussed with patient Patient is a candidate for molnupiravir if she test positive ER and return precautions discussed with patient  The patient was given the opportunity to ask questions.  All questions answered to their satisfaction.  The patient is in agreement to this plan.   Final Clinical Impressions(s) / UC Diagnoses   Final diagnoses:  Acute pharyngitis, unspecified etiology  Encounter for screening for COVID-19     Discharge Instructions      We have tested you today for COVID-19.  You will see the results in Mychart and we will call you with positive results.    Please stay home and isolate until you are aware of the results.    Some things that can make you feel better are: - Increased rest - Increasing fluid with water/sugar free electrolytes - Acetaminophen and ibuprofen as needed for fever/pain - Salt water gargling, chloraseptic spray and throat lozenges for sore throat - Flonase nasal spray for sore throat - OTC guaifenesin (Mucinex) 600 mg twice daily for congestion - Saline sinus flushes or a neti pot - Humidifying the air     ED  Prescriptions   None    PDMP not reviewed this encounter.   Valentino Nose, NP 09/19/22 [phone]

## 2022-09-19 NOTE — Discharge Instructions (Addendum)
We have tested you today for COVID-19.  You will see the results in Mychart and we will call you with positive results.    Please stay home and isolate until you are aware of the results.    Some things that can make you feel better are: - Increased rest - Increasing fluid with water/sugar free electrolytes - Acetaminophen and ibuprofen as needed for fever/pain - Salt water gargling, chloraseptic spray and throat lozenges for sore throat - Flonase nasal spray for sore throat - OTC guaifenesin (Mucinex) 600 mg twice daily for congestion - Saline sinus flushes or a neti pot - Humidifying the air

## 2022-09-20 LAB — SARS CORONAVIRUS 2 (TAT 6-24 HRS): SARS Coronavirus 2: NEGATIVE

## 2022-09-25 ENCOUNTER — Ambulatory Visit: Payer: Medicare PPO | Admitting: Orthopaedic Surgery

## 2022-09-25 ENCOUNTER — Other Ambulatory Visit (INDEPENDENT_AMBULATORY_CARE_PROVIDER_SITE_OTHER): Payer: Medicare PPO

## 2022-09-25 ENCOUNTER — Other Ambulatory Visit: Payer: Self-pay

## 2022-09-25 DIAGNOSIS — G8929 Other chronic pain: Secondary | ICD-10-CM

## 2022-09-25 DIAGNOSIS — M25561 Pain in right knee: Secondary | ICD-10-CM

## 2022-09-25 DIAGNOSIS — M25562 Pain in left knee: Secondary | ICD-10-CM

## 2022-09-25 NOTE — Progress Notes (Signed)
The patient is someone that I am seeing for the first time.  She comes in with right knee pain but both knees have been somewhat weak.  She says her right knee was hurting all over but now it is mainly the lateral aspect of the knee that hurts.  She is 72 years old.  She is on meloxicam.  She comes in today just to have her knee checked out she states.  She has never had injections in either knee and never had any surgery.  She denies any specific injury.  Examination of both knees shows patellofemoral crepitation.  Neither knee has effusion both knees have good range of motion and are ligamentously stable.  The left knee is asymptomatic today.  The right knee hurts mainly over the IT band.  X-rays of both knees show moderate tricompartment arthritis.  I do feel that she is dealing more with an inflammatory process with her IT band on that right side.  I recommended Voltaren gel as a topical anti-inflammatory over-the-counter 2-3 times a day.  She would also absolutely benefit from outpatient physical therapy for any modalities that therapist can try to help with her knees with strengthening as well as any modalities that can calm down her IT band pain down at the lateral aspect of the right knee.  Will see her back in a month after course of therapy.  She agrees with the treatment plan as well.  All questions and concerns were addressed and answered.

## 2022-10-04 DIAGNOSIS — N3946 Mixed incontinence: Secondary | ICD-10-CM | POA: Diagnosis not present

## 2022-10-04 DIAGNOSIS — R350 Frequency of micturition: Secondary | ICD-10-CM | POA: Diagnosis not present

## 2022-10-17 ENCOUNTER — Ambulatory Visit (HOSPITAL_COMMUNITY): Payer: Medicare PPO | Admitting: Physical Therapy

## 2022-10-23 ENCOUNTER — Other Ambulatory Visit: Payer: Self-pay

## 2022-10-23 ENCOUNTER — Encounter (HOSPITAL_COMMUNITY): Payer: Self-pay | Admitting: Physical Therapy

## 2022-10-23 ENCOUNTER — Ambulatory Visit (HOSPITAL_COMMUNITY): Payer: Medicare PPO | Attending: Orthopaedic Surgery | Admitting: Physical Therapy

## 2022-10-23 DIAGNOSIS — R29898 Other symptoms and signs involving the musculoskeletal system: Secondary | ICD-10-CM | POA: Diagnosis not present

## 2022-10-23 DIAGNOSIS — G8929 Other chronic pain: Secondary | ICD-10-CM | POA: Diagnosis not present

## 2022-10-23 DIAGNOSIS — R2689 Other abnormalities of gait and mobility: Secondary | ICD-10-CM | POA: Diagnosis not present

## 2022-10-23 DIAGNOSIS — M25561 Pain in right knee: Secondary | ICD-10-CM | POA: Insufficient documentation

## 2022-10-23 DIAGNOSIS — M25562 Pain in left knee: Secondary | ICD-10-CM | POA: Insufficient documentation

## 2022-10-23 DIAGNOSIS — M6281 Muscle weakness (generalized): Secondary | ICD-10-CM | POA: Insufficient documentation

## 2022-10-23 NOTE — Therapy (Signed)
OUTPATIENT PHYSICAL THERAPY LOWER EXTREMITY EVALUATION   Patient Name: Sherry Reed MRN: [id number] DOB:1951/01/21, 72 y.o., female Today's Date: 10/23/2022  END OF SESSION:  PT End of Session - 10/23/22 1122     Visit Number 1    Number of Visits 12    Date for PT Re-Evaluation 12/04/22    Authorization Type [employer]    Progress Note Due on Visit 10    PT Start Time 1122    PT Stop Time 1200    PT Time Calculation (min) 38 min    Activity Tolerance Patient tolerated treatment well    Behavior During Therapy WFL for tasks assessed/performed             Past Medical History:  Diagnosis Date   Arthritis    Female cystocele    grade 2   Hearing loss    bilateral   Hypothyroidism    On thyroid replacement   Observed sleep apnea    Status post UVPP   Rectocele    grade 1   Sleep apnea    SOB (shortness of breath)    Stress incontinence    Thyroid disease    Past Surgical History:  Procedure Laterality Date   THROAT SURGERY     TONSILLECTOMY     Patient Active Problem List   Diagnosis Date Noted   Bloating 05/24/2021   Flatulence 05/24/2021   Gas bloat syndrome 08/25/2020   Fecal smearing 08/25/2020   Change in bowel habits 08/25/2020   Low back pain 02/17/2013   Right knee pain 02/17/2013   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   IUD complication (HCC) 06/24/2011   Grave's disease 06/24/2011   Osteopenia 06/24/2011    PCP: Benita Stabile MD  REFERRING PROVIDER: Kathryne Hitch, MD  REFERRING DIAG: [phone] (ICD-10-CM) - Chronic pain of right knee M25.562,G89.29 (ICD-10-CM) - Chronic pain of left knee  THERAPY DIAG:  Pain in both knees, unspecified chronicity  Muscle weakness (generalized)  Other abnormalities of gait and mobility  Other symptoms and signs involving the musculoskeletal system  Rationale for Evaluation and Treatment: Rehabilitation  ONSET DATE: November 2023  SUBJECTIVE:   SUBJECTIVE  STATEMENT: Patient states knee pain with kneeling on R knee that was deep in the knee. Some pain on the outside of L knee. She is mostly concerned with leg weakness. She has not been able to water ski for about 2 years now and would love to be able to. Notes increased difficulty with cleaning, bending down, getting off the floor. No MOI and began sometime before Christmas. Doing a lot in the fall with picking up chestnuts.   PERTINENT HISTORY: Osteopenia PAIN:  Are you having pain? No  PRECAUTIONS: None  WEIGHT BEARING RESTRICTIONS: No  FALLS:  Has patient fallen in last 6 months? No  OCCUPATION: Retired  PLOF: Independent  PATIENT GOALS: get her knees stronger    OBJECTIVE:   DIAGNOSTIC FINDINGS: XRAY 5/13: R knee -moderate tricompartmental arthritis; L knee - moderate tricompartmental arthritis.   PATIENT SURVEYS: FOTO complete next session - time limited  COGNITION:Overall cognitive status: Within functional limits for tasks assessed     SENSATION: WFL   POSTURE: No Significant postural limitations  PALPATION: No TTP  LOWER EXTREMITY ROM:  Active ROM Right eval Left eval  Hip flexion    Hip extension    Hip abduction    Hip adduction    Hip internal rotation    Hip external rotation  Knee flexion 137 138  Knee extension 0 0  Ankle dorsiflexion    Ankle plantarflexion    Ankle inversion    Ankle eversion     (Blank rows = not tested)  LOWER EXTREMITY MMT:  MMT Right eval Left eval  Hip flexion 4+ 4+  Hip extension 4+ 4+  Hip abduction 4 4  Hip adduction    Hip internal rotation    Hip external rotation    Knee flexion 5 5  Knee extension 5 5  Ankle dorsiflexion 5 5  Ankle plantarflexion    Ankle inversion    Ankle eversion     (Blank rows = not tested)   FUNCTIONAL TESTS:  2 minute walk test: 460 feet Stairs: 7 inch; alternating pattern, decreased eccentric control bilaterally with greater deficit on LLE Lunge too/from floor:  requires UE support, labored Squat: able to complete deep squat with labored mechanics, hands on tights to return to standing  GAIT: Distance walked: 460 feet Assistive device utilized: None Level of assistance: Complete Independence Comments: , intermittently slight antalgic on LLE   TODAY'S TREATMENT:                                                                                                                              DATE:  10/23/22 Squat 1 x 10  Sidelying hip abduction 1 x 10 bilateral     PATIENT EDUCATION:  Education details: Patient educated on exam findings, POC, scope of PT, HEP. Person educated: Patient Education method: Explanation, Demonstration, and Handouts Education comprehension: verbalized understanding, returned demonstration, verbal cues required, and tactile cues required  HOME EXERCISE PROGRAM: Access Code: [email or web address] URL: [email or web address]  Date: 10/23/2022 - Squat  - 1 x daily - 7 x weekly - 2 sets - 10 reps - Sidelying Hip Abduction  - 1 x daily - 7 x weekly - 2 sets - 10 reps  ASSESSMENT:  CLINICAL IMPRESSION: Patient a 72 y.o. y.o. female who was seen today for physical therapy evaluation and treatment for bilateral knee pain. Patient presents with deficits in bilateral knee strength, ROM, endurance, activity tolerance, gait, balance, and functional mobility with ADL. Patient is having to modify and restrict ADL as indicated by outcome measure score as well as subjective information and objective measures which is affecting overall participation. Patient will benefit from skilled physical therapy in order to improve function and reduce impairment.  OBJECTIVE IMPAIRMENTS: Abnormal gait, decreased activity tolerance, decreased balance, decreased endurance, decreased mobility, difficulty walking, decreased ROM, decreased strength, impaired flexibility, improper body mechanics, and pain.   ACTIVITY LIMITATIONS: carrying, lifting,  bending, standing, squatting, stairs, transfers, locomotion level, and caring for others  PARTICIPATION LIMITATIONS: meal prep, cleaning, laundry, shopping, community activity, and yard work  PERSONAL FACTORS: Time since onset of injury/illness/exacerbation and 1-2 comorbidities: chronic knee pain, osteopenia  are also affecting patient's functional outcome.   REHAB POTENTIAL: Good  CLINICAL DECISION MAKING: Stable/uncomplicated  EVALUATION COMPLEXITY: Low  GOALS: Goals reviewed with patient? Yes  SHORT TERM GOALS: Target date: 11/13/2022    Patient will be independent with HEP in order to improve functional outcomes. Baseline: Goal status: INITIAL  2.  Patient will report at least 25% improvement in symptoms for improved quality of life. Baseline: Goal status: INITIAL    LONG TERM GOALS: Target date: 12/04/2022    Patient will report at least 75% improvement in symptoms for improved quality of life. Baseline:  Goal status: INITIAL  2.  Patient will improve FOTO score to predicted outcome in order to indicate improved tolerance to activity. Baseline:  Goal status: INITIAL  3.  Patient will be able to navigate stairs with reciprocal pattern without compensation in order to demonstrate improved LE strength. Baseline: see above Goal status: INITIAL  4.  Patient will demonstrate grade of 5/5 MMT grade in all tested musculature as evidence of improved strength to assist with stair ambulation and gait. Baseline: see above Goal status: INITIAL  5.  Patient will be able to complete advanced HEP in order to continue improving strength for participation in all ADL with ease. Baseline:  Goal status: INITIAL     PLAN:  PT FREQUENCY: 1-2x/week  PT DURATION: 6 weeks  PLANNED INTERVENTIONS: Therapeutic exercises, Therapeutic activity, Neuromuscular re-education, Balance training, Gait training, Patient/Family education, Joint manipulation, Joint mobilization, Stair  training, Orthotic/Fit training, DME instructions, Aquatic Therapy, Dry Needling, Electrical stimulation, Spinal manipulation, Spinal mobilization, Cryotherapy, Moist heat, Compression bandaging, scar mobilization, Splintting, Taping, Traction, Ultrasound, Ionotophoresis 4mg /ml Dexamethasone, and Manual therapy  PLAN FOR NEXT SESSION: complete FOTO, hip and quad strength, functional strength   Reola Mosher Melik Blancett, PT 10/23/2022, 11:50 AM

## 2022-10-25 DIAGNOSIS — E782 Mixed hyperlipidemia: Secondary | ICD-10-CM | POA: Diagnosis not present

## 2022-10-25 DIAGNOSIS — E039 Hypothyroidism, unspecified: Secondary | ICD-10-CM | POA: Diagnosis not present

## 2022-10-26 ENCOUNTER — Ambulatory Visit: Payer: Medicare PPO | Admitting: Orthopaedic Surgery

## 2022-11-08 ENCOUNTER — Encounter (HOSPITAL_COMMUNITY): Payer: Self-pay

## 2022-11-08 ENCOUNTER — Ambulatory Visit (HOSPITAL_COMMUNITY): Payer: Medicare PPO

## 2022-11-08 DIAGNOSIS — M25561 Pain in right knee: Secondary | ICD-10-CM | POA: Diagnosis not present

## 2022-11-08 DIAGNOSIS — R29898 Other symptoms and signs involving the musculoskeletal system: Secondary | ICD-10-CM | POA: Diagnosis not present

## 2022-11-08 DIAGNOSIS — M25562 Pain in left knee: Secondary | ICD-10-CM | POA: Diagnosis not present

## 2022-11-08 DIAGNOSIS — R2689 Other abnormalities of gait and mobility: Secondary | ICD-10-CM | POA: Diagnosis not present

## 2022-11-08 DIAGNOSIS — M6281 Muscle weakness (generalized): Secondary | ICD-10-CM | POA: Diagnosis not present

## 2022-11-08 DIAGNOSIS — G8929 Other chronic pain: Secondary | ICD-10-CM | POA: Diagnosis not present

## 2022-11-08 NOTE — Therapy (Signed)
OUTPATIENT PHYSICAL THERAPY LOWER EXTREMITY TREATMENT   Patient Name: Sherry Reed MRN: [id number] DOB:10/18/1950, 72 y.o., female Today's Date: 11/08/2022  END OF SESSION:  PT End of Session - 11/08/22 0816     Visit Number 2    Number of Visits 12    Date for PT Re-Evaluation 12/04/22    Authorization Type [employer]    Authorization Time Period cohere approved 1 re-eval and 12 visits from 10/23/2022-12/04/2022    Progress Note Due on Visit 10    PT Start Time 0818    PT Stop Time 0858    PT Time Calculation (min) 40 min    Activity Tolerance Patient tolerated treatment well    Behavior During Therapy [facility] for tasks assessed/performed             Past Medical History:  Diagnosis Date   Arthritis    Female cystocele    grade 2   Hearing loss    bilateral   Hypothyroidism    On thyroid replacement   Observed sleep apnea    Status post UVPP   Rectocele    grade 1   Sleep apnea    SOB (shortness of breath)    Stress incontinence    Thyroid disease    Past Surgical History:  Procedure Laterality Date   THROAT SURGERY     TONSILLECTOMY     Patient Active Problem List   Diagnosis Date Noted   Bloating 05/24/2021   Flatulence 05/24/2021   Gas bloat syndrome 08/25/2020   Fecal smearing 08/25/2020   Change in bowel habits 08/25/2020   Low back pain 02/17/2013   Right knee pain 02/17/2013   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   IUD complication (HCC) 06/24/2011   Grave's disease 06/24/2011   Osteopenia 06/24/2011    PCP: Benita Stabile MD  REFERRING PROVIDER: Kathryne Hitch, MD  REFERRING DIAG: [phone] (ICD-10-CM) - Chronic pain of right knee M25.562,G89.29 (ICD-10-CM) - Chronic pain of left knee  THERAPY DIAG:  Muscle weakness (generalized)  Pain in both knees, unspecified chronicity  Other abnormalities of gait and mobility  Other symptoms and signs involving the musculoskeletal system  Rationale for  Evaluation and Treatment: Rehabilitation  ONSET DATE: November 2023  SUBJECTIVE:   SUBJECTIVE STATEMENT: 11/08/22:  Pt reports she has been rubbing on cream at night that seems to help.  Reports she has been traveling and company at home so hasn't began exercises yet.  No reports of pain currently.  Eval:  Patient states knee pain with kneeling on R knee that was deep in the knee. Some pain on the outside of L knee. She is mostly concerned with leg weakness. She has not been able to water ski for about 2 years now and would love to be able to. Notes increased difficulty with cleaning, bending down, getting off the floor. No MOI and began sometime before Christmas. Doing a lot in the fall with picking up chestnuts.   PERTINENT HISTORY: Osteopenia PAIN:  Are you having pain? No  PRECAUTIONS: None  WEIGHT BEARING RESTRICTIONS: No  FALLS:  Has patient fallen in last 6 months? No  OCCUPATION: Retired  PLOF: Independent  PATIENT GOALS: get her knees stronger    OBJECTIVE:   DIAGNOSTIC FINDINGS: XRAY 5/13: R knee -moderate tricompartmental arthritis; L knee - moderate tricompartmental arthritis.   PATIENT SURVEYS: FOTO complete next session - time limited  COGNITION:Overall cognitive status: Within functional limits for tasks assessed  SENSATION: WFL   POSTURE: No Significant postural limitations  PALPATION: No TTP  LOWER EXTREMITY ROM:  Active ROM Right eval Left eval  Hip flexion    Hip extension    Hip abduction    Hip adduction    Hip internal rotation    Hip external rotation    Knee flexion 137 138  Knee extension 0 0  Ankle dorsiflexion    Ankle plantarflexion    Ankle inversion    Ankle eversion     (Blank rows = not tested)  LOWER EXTREMITY MMT:  MMT Right eval Left eval  Hip flexion 4+ 4+  Hip extension 4+ 4+  Hip abduction 4 4  Hip adduction    Hip internal rotation    Hip external rotation    Knee flexion 5 5  Knee extension 5 5   Ankle dorsiflexion 5 5  Ankle plantarflexion    Ankle inversion    Ankle eversion     (Blank rows = not tested)   FUNCTIONAL TESTS:  2 minute walk test: 460 feet Stairs: 7 inch; alternating pattern, decreased eccentric control bilaterally with greater deficit on LLE Lunge too/from floor: requires UE support, labored Squat: able to complete deep squat with labored mechanics, hands on tights to return to standing  GAIT: Distance walked: 460 feet Assistive device utilized: None Level of assistance: Complete Independence Comments: , intermittently slight antalgic on LLE   TODAY'S TREATMENT:                                                                                                                              DATE:  11/08/22: Reviewed goals Educated importance of HEP compliance for maximal benefits FOTO 63% Standing:  Squats with cueing for mechanics front of chair 10x  SLS: Rt 15", Lt 14"  Tandem stance 2x 30" intermittent HHA Sidelying hip abduction 1 x 10 bilateral   Supine: bridges 10x   10/23/22 Squat 1 x 10  Sidelying hip abduction 1 x 10 bilateral     PATIENT EDUCATION:  Education details: Patient educated on exam findings, POC, scope of PT, HEP. Person educated: Patient Education method: Explanation, Demonstration, and Handouts Education comprehension: verbalized understanding, returned demonstration, verbal cues required, and tactile cues required  HOME EXERCISE PROGRAM: Access Code: [email or web address] URL: [email or web address]  Date: 10/23/2022 - Squat  - 1 x daily - 7 x weekly - 2 sets - 10 reps - Sidelying Hip Abduction  - 1 x daily - 7 x weekly - 2 sets - 10 reps  Access Code: [email or web address] URL: [email or web address] Date: 11/08/2022 Prepared by: Becky Sax  Exercises - Squat  - 1 x daily - 7 x weekly - 2 sets - 10 reps - Sidelying Hip Abduction  - 1 x daily - 7 x weekly - 2 sets - 10 reps - Supine Bridge  - 1 x daily - 7  x weekly - 2 sets - 10 reps - Single Leg Stance  -  1 x daily - 7 x weekly - 3 sets - 3 reps - 30" hold  ASSESSMENT:  CLINICAL IMPRESSION: Reviewed goals and educated importance of HEP compliance.  Pt reports she has been on a cruise and company at home so hasn't began current exercises yet.  Reviewed current exercise program with cueing for form and mechanics, encouraged controlled movements with all exercise.  FOTO complete this session with score of 63% for self perceived functional abilities. Session focus with hip stability with min cueing for proper form and mechanics.  Added additional exercises for strengthening and balance training with handout given and verbalized understanding.  No reports of pain through session.  Eval:  Patient a 72 y.o. y.o. female who was seen today for physical therapy evaluation and treatment for bilateral knee pain. Patient presents with deficits in bilateral knee strength, ROM, endurance, activity tolerance, gait, balance, and functional mobility with ADL. Patient is having to modify and restrict ADL as indicated by outcome measure score as well as subjective information and objective measures which is affecting overall participation. Patient will benefit from skilled physical therapy in order to improve function and reduce impairment.  OBJECTIVE IMPAIRMENTS: Abnormal gait, decreased activity tolerance, decreased balance, decreased endurance, decreased mobility, difficulty walking, decreased ROM, decreased strength, impaired flexibility, improper body mechanics, and pain.   ACTIVITY LIMITATIONS: carrying, lifting, bending, standing, squatting, stairs, transfers, locomotion level, and caring for others  PARTICIPATION LIMITATIONS: meal prep, cleaning, laundry, shopping, community activity, and yard work  PERSONAL FACTORS: Time since onset of injury/illness/exacerbation and 1-2 comorbidities: chronic knee pain, osteopenia  are also affecting patient's functional  outcome.   REHAB POTENTIAL: Good  CLINICAL DECISION MAKING: Stable/uncomplicated  EVALUATION COMPLEXITY: Low   GOALS: Goals reviewed with patient? Yes  SHORT TERM GOALS: Target date: 11/13/2022    Patient will be independent with HEP in order to improve functional outcomes. Baseline: Goal status: IN PROGRESS  2.  Patient will report at least 25% improvement in symptoms for improved quality of life. Baseline: Goal status: IN PROGRESS    LONG TERM GOALS: Target date: 12/04/2022    Patient will report at least 75% improvement in symptoms for improved quality of life. Baseline:  Goal status: IN PROGRESS  2.  Patient will improve FOTO score to predicted outcome in order to indicate improved tolerance to activity. Baseline:  Goal status: IN PROGRESS  3.  Patient will be able to navigate stairs with reciprocal pattern without compensation in order to demonstrate improved LE strength. Baseline: see above Goal status: IN PROGRESS  4.  Patient will demonstrate grade of 5/5 MMT grade in all tested musculature as evidence of improved strength to assist with stair ambulation and gait. Baseline: see above Goal status: IN PROGRESS  5.  Patient will be able to complete advanced HEP in order to continue improving strength for participation in all ADL with ease. Baseline:  Goal status: IN PROGRESS     PLAN:  PT FREQUENCY: 1-2x/week  PT DURATION: 6 weeks  PLANNED INTERVENTIONS: Therapeutic exercises, Therapeutic activity, Neuromuscular re-education, Balance training, Gait training, Patient/Family education, Joint manipulation, Joint mobilization, Stair training, Orthotic/Fit training, DME instructions, Aquatic Therapy, Dry Needling, Electrical stimulation, Spinal manipulation, Spinal mobilization, Cryotherapy, Moist heat, Compression bandaging, scar mobilization, Splintting, Taping, Traction, Ultrasound, Ionotophoresis 4mg /ml Dexamethasone, and Manual therapy  PLAN FOR NEXT  SESSION: hip and quad strength, functional strength  Becky Sax, LPTA/CLT; CBIS [phone]  Juel Burrow, PTA 11/08/2022, 10:03 AM

## 2022-11-13 DIAGNOSIS — E039 Hypothyroidism, unspecified: Secondary | ICD-10-CM | POA: Diagnosis not present

## 2022-11-13 DIAGNOSIS — R350 Frequency of micturition: Secondary | ICD-10-CM | POA: Diagnosis not present

## 2022-11-13 DIAGNOSIS — M8580 Other specified disorders of bone density and structure, unspecified site: Secondary | ICD-10-CM | POA: Diagnosis not present

## 2022-11-13 DIAGNOSIS — E559 Vitamin D deficiency, unspecified: Secondary | ICD-10-CM | POA: Diagnosis not present

## 2022-11-13 DIAGNOSIS — G4719 Other hypersomnia: Secondary | ICD-10-CM | POA: Diagnosis not present

## 2022-11-13 DIAGNOSIS — K580 Irritable bowel syndrome with diarrhea: Secondary | ICD-10-CM | POA: Diagnosis not present

## 2022-11-13 DIAGNOSIS — R944 Abnormal results of kidney function studies: Secondary | ICD-10-CM | POA: Diagnosis not present

## 2022-11-13 DIAGNOSIS — N3946 Mixed incontinence: Secondary | ICD-10-CM | POA: Diagnosis not present

## 2022-11-13 DIAGNOSIS — E782 Mixed hyperlipidemia: Secondary | ICD-10-CM | POA: Diagnosis not present

## 2022-11-14 ENCOUNTER — Encounter (HOSPITAL_COMMUNITY): Payer: Self-pay

## 2022-11-14 ENCOUNTER — Ambulatory Visit (HOSPITAL_COMMUNITY): Payer: Medicare PPO | Attending: Orthopaedic Surgery

## 2022-11-14 DIAGNOSIS — R29898 Other symptoms and signs involving the musculoskeletal system: Secondary | ICD-10-CM | POA: Insufficient documentation

## 2022-11-14 DIAGNOSIS — R2689 Other abnormalities of gait and mobility: Secondary | ICD-10-CM | POA: Diagnosis not present

## 2022-11-14 DIAGNOSIS — M25562 Pain in left knee: Secondary | ICD-10-CM | POA: Diagnosis not present

## 2022-11-14 DIAGNOSIS — M25561 Pain in right knee: Secondary | ICD-10-CM | POA: Insufficient documentation

## 2022-11-14 DIAGNOSIS — M6281 Muscle weakness (generalized): Secondary | ICD-10-CM | POA: Diagnosis not present

## 2022-11-14 NOTE — Therapy (Signed)
OUTPATIENT PHYSICAL THERAPY LOWER EXTREMITY TREATMENT   Patient Name: Sherry Reed MRN: [id number] DOB:10-30-1950, 72 y.o., female Today's Date: 11/14/2022  END OF SESSION:  PT End of Session - 11/14/22 0937     Visit Number 3    Number of Visits 12    Date for PT Re-Evaluation 12/04/22    Authorization Type [employer]    Authorization Time Period cohere approved 1 re-eval and 12 visits from 10/23/2022-12/04/2022    Progress Note Due on Visit 10    PT Start Time 0907    PT Stop Time 0947    PT Time Calculation (min) 40 min    Activity Tolerance Patient tolerated treatment well    Behavior During Therapy [facility] for tasks assessed/performed              Past Medical History:  Diagnosis Date   Arthritis    Female cystocele    grade 2   Hearing loss    bilateral   Hypothyroidism    On thyroid replacement   Observed sleep apnea    Status post UVPP   Rectocele    grade 1   Sleep apnea    SOB (shortness of breath)    Stress incontinence    Thyroid disease    Past Surgical History:  Procedure Laterality Date   THROAT SURGERY     TONSILLECTOMY     Patient Active Problem List   Diagnosis Date Noted   Bloating 05/24/2021   Flatulence 05/24/2021   Gas bloat syndrome 08/25/2020   Fecal smearing 08/25/2020   Change in bowel habits 08/25/2020   Low back pain 02/17/2013   Right knee pain 02/17/2013   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   IUD complication (HCC) 06/24/2011   Grave's disease 06/24/2011   Osteopenia 06/24/2011    PCP: Benita Stabile MD  REFERRING PROVIDER: Kathryne Hitch, MD  REFERRING DIAG: [phone] (ICD-10-CM) - Chronic pain of right knee M25.562,G89.29 (ICD-10-CM) - Chronic pain of left knee  THERAPY DIAG:  Muscle weakness (generalized)  Pain in both knees, unspecified chronicity  Other symptoms and signs involving the musculoskeletal system  Other abnormalities of gait and mobility  Rationale for  Evaluation and Treatment: Rehabilitation  ONSET DATE: November 2023  SUBJECTIVE:   SUBJECTIVE STATEMENT: 11/14/22:  Pt stated she is feeling good, no reports of pain just feels weak with Lt knee.  Reports balance continues to be difficult, husband showed off his abilities with ease.  Has began HEP regularly, would like to review form this session.    Eval:  Patient states knee pain with kneeling on R knee that was deep in the knee. Some pain on the outside of L knee. She is mostly concerned with leg weakness. She has not been able to water ski for about 2 years now and would love to be able to. Notes increased difficulty with cleaning, bending down, getting off the floor. No MOI and began sometime before Christmas. Doing a lot in the fall with picking up chestnuts.   PERTINENT HISTORY: Osteopenia PAIN:  Are you having pain? No  PRECAUTIONS: None  WEIGHT BEARING RESTRICTIONS: No  FALLS:  Has patient fallen in last 6 months? No  OCCUPATION: Retired  PLOF: Independent  PATIENT GOALS: get her knees stronger    OBJECTIVE:   DIAGNOSTIC FINDINGS: XRAY 5/13: R knee -moderate tricompartmental arthritis; L knee - moderate tricompartmental arthritis.   PATIENT SURVEYS: FOTO 11/06/22: FOTO 63%   COGNITION:Overall cognitive status: Within  functional limits for tasks assessed     SENSATION: WFL   POSTURE: No Significant postural limitations  PALPATION: No TTP  LOWER EXTREMITY ROM:  Active ROM Right eval Left eval  Hip flexion    Hip extension    Hip abduction    Hip adduction    Hip internal rotation    Hip external rotation    Knee flexion 137 138  Knee extension 0 0  Ankle dorsiflexion    Ankle plantarflexion    Ankle inversion    Ankle eversion     (Blank rows = not tested)  LOWER EXTREMITY MMT:  MMT Right eval Left eval  Hip flexion 4+ 4+  Hip extension 4+ 4+  Hip abduction 4 4  Hip adduction    Hip internal rotation    Hip external rotation    Knee  flexion 5 5  Knee extension 5 5  Ankle dorsiflexion 5 5  Ankle plantarflexion    Ankle inversion    Ankle eversion     (Blank rows = not tested)   FUNCTIONAL TESTS:  2 minute walk test: 460 feet Stairs: 7 inch; alternating pattern, decreased eccentric control bilaterally with greater deficit on LLE Lunge too/from floor: requires UE support, labored Squat: able to complete deep squat with labored mechanics, hands on tights to return to standing  GAIT: Distance walked: 460 feet Assistive device utilized: None Level of assistance: Complete Independence Comments: , intermittently slight antalgic on LLE   TODAY'S TREATMENT:                                                                                                                              DATE:  11/14/22: Heel raise 10x Toe raises 10x  Squat 10x  SLS Rt 22", Lt 11" max Abduction 10x BLE Tandem stance 2x 30" March 10x 3" with finger assistance  Sitting: STS 10x no HHA, eccentric control Supine: bridge 10x Sidelying: hip abduction 10x  11/08/22: Reviewed goals Educated importance of HEP compliance for maximal benefits FOTO 63% Standing:  Squats with cueing for mechanics front of chair 10x  SLS: Rt 15", Lt 14"  Tandem stance 2x 30" intermittent HHA Sidelying hip abduction 1 x 10 bilateral   Supine: bridges 10x   10/23/22 Squat 1 x 10  Sidelying hip abduction 1 x 10 bilateral     PATIENT EDUCATION:  Education details: Patient educated on exam findings, POC, scope of PT, HEP. Person educated: Patient Education method: Explanation, Demonstration, and Handouts Education comprehension: verbalized understanding, returned demonstration, verbal cues required, and tactile cues required  HOME EXERCISE PROGRAM: Access Code: [email or web address] URL: [email or web address]  Date: 10/23/2022 - Squat  - 1 x daily - 7 x weekly - 2 sets - 10 reps - Sidelying Hip Abduction  - 1 x daily - 7 x weekly - 2 sets - 10  reps  Access Code: [email or web address] URL: [email or web address] Date: 11/08/2022 Prepared by: Becky Sax  Exercises - Squat  -  1 x daily - 7 x weekly - 2 sets - 10 reps - Sidelying Hip Abduction  - 1 x daily - 7 x weekly - 2 sets - 10 reps - Supine Bridge  - 1 x daily - 7 x weekly - 2 sets - 10 reps - Single Leg Stance  - 1 x daily - 7 x weekly - 3 sets - 3 reps - 30" hold  ASSESSMENT:  CLINICAL IMPRESSION: Session focus with LE strengthening.  Reviewed form and mechanics with current HEP, pt able to demonstrate good mechanics with no cueing required.  Added strengthening and balance activities to POC with no reports of increased pain through session.  Pt presents with some difficulty with SLS and NBOS based exercises, HHA available PRN.  Added static tandem stance and abduction to HEP with printout given and verbalized understanding.       Eval:  Patient a 72 y.o. y.o. female who was seen today for physical therapy evaluation and treatment for bilateral knee pain. Patient presents with deficits in bilateral knee strength, ROM, endurance, activity tolerance, gait, balance, and functional mobility with ADL. Patient is having to modify and restrict ADL as indicated by outcome measure score as well as subjective information and objective measures which is affecting overall participation. Patient will benefit from skilled physical therapy in order to improve function and reduce impairment.  OBJECTIVE IMPAIRMENTS: Abnormal gait, decreased activity tolerance, decreased balance, decreased endurance, decreased mobility, difficulty walking, decreased ROM, decreased strength, impaired flexibility, improper body mechanics, and pain.   ACTIVITY LIMITATIONS: carrying, lifting, bending, standing, squatting, stairs, transfers, locomotion level, and caring for others  PARTICIPATION LIMITATIONS: meal prep, cleaning, laundry, shopping, community activity, and yard work  PERSONAL FACTORS: Time since  onset of injury/illness/exacerbation and 1-2 comorbidities: chronic knee pain, osteopenia  are also affecting patient's functional outcome.   REHAB POTENTIAL: Good  CLINICAL DECISION MAKING: Stable/uncomplicated  EVALUATION COMPLEXITY: Low   GOALS: Goals reviewed with patient? Yes  SHORT TERM GOALS: Target date: 11/13/2022    Patient will be independent with HEP in order to improve functional outcomes. Baseline: Goal status: IN PROGRESS  2.  Patient will report at least 25% improvement in symptoms for improved quality of life. Baseline: Goal status: IN PROGRESS    LONG TERM GOALS: Target date: 12/04/2022    Patient will report at least 75% improvement in symptoms for improved quality of life. Baseline:  Goal status: IN PROGRESS  2.  Patient will improve FOTO score to predicted outcome in order to indicate improved tolerance to activity. Baseline:  Goal status: IN PROGRESS  3.  Patient will be able to navigate stairs with reciprocal pattern without compensation in order to demonstrate improved LE strength. Baseline: see above Goal status: IN PROGRESS  4.  Patient will demonstrate grade of 5/5 MMT grade in all tested musculature as evidence of improved strength to assist with stair ambulation and gait. Baseline: see above Goal status: IN PROGRESS  5.  Patient will be able to complete advanced HEP in order to continue improving strength for participation in all ADL with ease. Baseline:  Goal status: IN PROGRESS     PLAN:  PT FREQUENCY: 1-2x/week  PT DURATION: 6 weeks  PLANNED INTERVENTIONS: Therapeutic exercises, Therapeutic activity, Neuromuscular re-education, Balance training, Gait training, Patient/Family education, Joint manipulation, Joint mobilization, Stair training, Orthotic/Fit training, DME instructions, Aquatic Therapy, Dry Needling, Electrical stimulation, Spinal manipulation, Spinal mobilization, Cryotherapy, Moist heat, Compression bandaging, scar  mobilization, Splintting, Taping, Traction, Ultrasound, Ionotophoresis 4mg /ml Dexamethasone,  and Manual therapy  PLAN FOR NEXT SESSION: hip and quad strength, functional strength.  Add sidestep, vector stance and step up training next session.    Becky Sax, LPTA/CLT; CBIS [phone]  Juel Burrow, PTA 11/14/2022, 10:59 AM

## 2022-11-21 ENCOUNTER — Encounter (HOSPITAL_COMMUNITY): Payer: Medicare PPO | Admitting: Physical Therapy

## 2022-11-23 ENCOUNTER — Encounter (HOSPITAL_COMMUNITY): Payer: Self-pay

## 2022-11-23 ENCOUNTER — Ambulatory Visit (HOSPITAL_COMMUNITY): Payer: Medicare PPO

## 2022-11-23 DIAGNOSIS — M6281 Muscle weakness (generalized): Secondary | ICD-10-CM | POA: Diagnosis not present

## 2022-11-23 DIAGNOSIS — M25562 Pain in left knee: Secondary | ICD-10-CM | POA: Diagnosis not present

## 2022-11-23 DIAGNOSIS — R2689 Other abnormalities of gait and mobility: Secondary | ICD-10-CM | POA: Diagnosis not present

## 2022-11-23 DIAGNOSIS — M25561 Pain in right knee: Secondary | ICD-10-CM | POA: Diagnosis not present

## 2022-11-23 DIAGNOSIS — R29898 Other symptoms and signs involving the musculoskeletal system: Secondary | ICD-10-CM | POA: Diagnosis not present

## 2022-11-23 NOTE — Therapy (Signed)
OUTPATIENT PHYSICAL THERAPY LOWER EXTREMITY TREATMENT   Patient Name: Sherry Reed MRN: [id number] DOB:August 01, 1950, 72 y.o., female Today's Date: 11/23/2022  END OF SESSION:  PT End of Session - 11/23/22 0903     Visit Number 4    Number of Visits 12    Date for PT Re-Evaluation 12/04/22    Authorization Type [employer]    Authorization Time Period cohere approved 1 re-eval and 12 visits from 10/23/2022-12/04/2022    Progress Note Due on Visit 10    PT Start Time 0903    PT Stop Time 0942    PT Time Calculation (min) 39 min    Activity Tolerance Patient tolerated treatment well    Behavior During Therapy [facility] for tasks assessed/performed              Past Medical History:  Diagnosis Date   Arthritis    Female cystocele    grade 2   Hearing loss    bilateral   Hypothyroidism    On thyroid replacement   Observed sleep apnea    Status post UVPP   Rectocele    grade 1   Sleep apnea    SOB (shortness of breath)    Stress incontinence    Thyroid disease    Past Surgical History:  Procedure Laterality Date   THROAT SURGERY     TONSILLECTOMY     Patient Active Problem List   Diagnosis Date Noted   Bloating 05/24/2021   Flatulence 05/24/2021   Gas bloat syndrome 08/25/2020   Fecal smearing 08/25/2020   Change in bowel habits 08/25/2020   Low back pain 02/17/2013   Right knee pain 02/17/2013   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   IUD complication (HCC) 06/24/2011   Grave's disease 06/24/2011   Osteopenia 06/24/2011    PCP: Benita Stabile MD  REFERRING PROVIDER: Kathryne Hitch, MD  REFERRING DIAG: [phone] (ICD-10-CM) - Chronic pain of right knee M25.562,G89.29 (ICD-10-CM) - Chronic pain of left knee  THERAPY DIAG:  Muscle weakness (generalized)  Pain in both knees, unspecified chronicity  Other symptoms and signs involving the musculoskeletal system  Other abnormalities of gait and mobility  Rationale for  Evaluation and Treatment: Rehabilitation  ONSET DATE: November 2023  SUBJECTIVE:   SUBJECTIVE STATEMENT: 11/23/22:  Feeling good today, no reports of pain currently.    Eval:  Patient states knee pain with kneeling on R knee that was deep in the knee. Some pain on the outside of L knee. She is mostly concerned with leg weakness. She has not been able to water ski for about 2 years now and would love to be able to. Notes increased difficulty with cleaning, bending down, getting off the floor. No MOI and began sometime before Christmas. Doing a lot in the fall with picking up chestnuts.   PERTINENT HISTORY: Osteopenia PAIN:  Are you having pain? No  PRECAUTIONS: None  WEIGHT BEARING RESTRICTIONS: No  FALLS:  Has patient fallen in last 6 months? No  OCCUPATION: Retired  PLOF: Independent  PATIENT GOALS: get her knees stronger    OBJECTIVE:   DIAGNOSTIC FINDINGS: XRAY 5/13: R knee -moderate tricompartmental arthritis; L knee - moderate tricompartmental arthritis.   PATIENT SURVEYS: FOTO 11/06/22: FOTO 63%   COGNITION:Overall cognitive status: Within functional limits for tasks assessed     SENSATION: WFL   POSTURE: No Significant postural limitations  PALPATION: No TTP  LOWER EXTREMITY ROM:  Active ROM Right eval Left eval  Hip flexion    Hip extension    Hip abduction    Hip adduction    Hip internal rotation    Hip external rotation    Knee flexion 137 138  Knee extension 0 0  Ankle dorsiflexion    Ankle plantarflexion    Ankle inversion    Ankle eversion     (Blank rows = not tested)  LOWER EXTREMITY MMT:  MMT Right eval Left eval  Hip flexion 4+ 4+  Hip extension 4+ 4+  Hip abduction 4 4  Hip adduction    Hip internal rotation    Hip external rotation    Knee flexion 5 5  Knee extension 5 5  Ankle dorsiflexion 5 5  Ankle plantarflexion    Ankle inversion    Ankle eversion     (Blank rows = not tested)   FUNCTIONAL TESTS:  2 minute  walk test: 460 feet Stairs: 7 inch; alternating pattern, decreased eccentric control bilaterally with greater deficit on LLE Lunge too/from floor: requires UE support, labored Squat: able to complete deep squat with labored mechanics, hands on tights to return to standing  GAIT: Distance walked: 460 feet Assistive device utilized: None Level of assistance: Complete Independence Comments: , intermittently slight antalgic on LLE   TODAY'S TREATMENT:                                                                                                                              DATE:  11/23/22: Heel raise incline slope 10x Toe raise decline slope 10x Squat 10x front of chair SLS Rt 11", 16" Vector stance 3x 5" 1 HHA Sidestep 4RT GTB around thigh Forward step up 4 then 6in 10x Lateral step up 4in 10x each side March 10x 3" with finger assistance Eccentric STS 10x no HHA Tandem stance 1x 30" on foam: 1x30"   11/14/22: Heel raise 10x Toe raises 10x  Squat 10x  SLS Rt 22", Lt 11" max Abduction 10x BLE Tandem stance 2x 30" March 10x 3" with finger assistance  Sitting: STS 10x no HHA, eccentric control Supine: bridge 10x Sidelying: hip abduction 10x  11/08/22: Reviewed goals Educated importance of HEP compliance for maximal benefits FOTO 63% Standing:  Squats with cueing for mechanics front of chair 10x  SLS: Rt 15", Lt 14"  Tandem stance 2x 30" intermittent HHA Sidelying hip abduction 1 x 10 bilateral   Supine: bridges 10x   10/23/22 Squat 1 x 10  Sidelying hip abduction 1 x 10 bilateral     PATIENT EDUCATION:  Education details: Patient educated on exam findings, POC, scope of PT, HEP. Person educated: Patient Education method: Explanation, Demonstration, and Handouts Education comprehension: verbalized understanding, returned demonstration, verbal cues required, and tactile cues required  HOME EXERCISE PROGRAM: Access Code: [email or web address] URL:  [email or web address]  Date: 10/23/2022 - Squat  - 1 x daily - 7 x weekly - 2 sets - 10 reps - Sidelying Hip Abduction  -  1 x daily - 7 x weekly - 2 sets - 10 reps  Access Code: [email or web address] URL: [email or web address] Date: 11/08/2022 Prepared by: Becky Sax  Exercises - Squat  - 1 x daily - 7 x weekly - 2 sets - 10 reps - Sidelying Hip Abduction  - 1 x daily - 7 x weekly - 2 sets - 10 reps - Supine Bridge  - 1 x daily - 7 x weekly - 2 sets - 10 reps - Single Leg Stance  - 1 x daily - 7 x weekly - 3 sets - 3 reps - 30" hold  ASSESSMENT:  CLINICAL IMPRESSION: Progressed LE strengthening with additional standing exercises.  Pt able to complete all exercises with good form following initial instructions.  Added vector stance, sidestep and progressed to dynamic surface with tandem stance for gluteal stability to assist with balance.  Also added step up training for functional strengthening.  Pt tolerated well with no reports of increased pain, was limited by fatigue.   Eval:  Patient a 72 y.o. y.o. female who was seen today for physical therapy evaluation and treatment for bilateral knee pain. Patient presents with deficits in bilateral knee strength, ROM, endurance, activity tolerance, gait, balance, and functional mobility with ADL. Patient is having to modify and restrict ADL as indicated by outcome measure score as well as subjective information and objective measures which is affecting overall participation. Patient will benefit from skilled physical therapy in order to improve function and reduce impairment.  OBJECTIVE IMPAIRMENTS: Abnormal gait, decreased activity tolerance, decreased balance, decreased endurance, decreased mobility, difficulty walking, decreased ROM, decreased strength, impaired flexibility, improper body mechanics, and pain.   ACTIVITY LIMITATIONS: carrying, lifting, bending, standing, squatting, stairs, transfers, locomotion level, and  caring for others  PARTICIPATION LIMITATIONS: meal prep, cleaning, laundry, shopping, community activity, and yard work  PERSONAL FACTORS: Time since onset of injury/illness/exacerbation and 1-2 comorbidities: chronic knee pain, osteopenia  are also affecting patient's functional outcome.   REHAB POTENTIAL: Good  CLINICAL DECISION MAKING: Stable/uncomplicated  EVALUATION COMPLEXITY: Low   GOALS: Goals reviewed with patient? Yes  SHORT TERM GOALS: Target date: 11/13/2022    Patient will be independent with HEP in order to improve functional outcomes. Baseline: Goal status: IN PROGRESS  2.  Patient will report at least 25% improvement in symptoms for improved quality of life. Baseline: Goal status: IN PROGRESS    LONG TERM GOALS: Target date: 12/04/2022    Patient will report at least 75% improvement in symptoms for improved quality of life. Baseline:  Goal status: IN PROGRESS  2.  Patient will improve FOTO score to predicted outcome in order to indicate improved tolerance to activity. Baseline:  Goal status: IN PROGRESS  3.  Patient will be able to navigate stairs with reciprocal pattern without compensation in order to demonstrate improved LE strength. Baseline: see above Goal status: IN PROGRESS  4.  Patient will demonstrate grade of 5/5 MMT grade in all tested musculature as evidence of improved strength to assist with stair ambulation and gait. Baseline: see above Goal status: IN PROGRESS  5.  Patient will be able to complete advanced HEP in order to continue improving strength for participation in all ADL with ease. Baseline:  Goal status: IN PROGRESS     PLAN:  PT FREQUENCY: 1-2x/week  PT DURATION: 6 weeks  PLANNED INTERVENTIONS: Therapeutic exercises, Therapeutic activity, Neuromuscular re-education, Balance training, Gait training, Patient/Family education, Joint manipulation, Joint mobilization, Stair training, Orthotic/Fit training, DME  instructions, Aquatic Therapy, Dry  Needling, Electrical stimulation, Spinal manipulation, Spinal mobilization, Cryotherapy, Moist heat, Compression bandaging, scar mobilization, Splintting, Taping, Traction, Ultrasound, Ionotophoresis 4mg /ml Dexamethasone, and Manual therapy  PLAN FOR NEXT SESSION: hip and quad strength, functional strength.  Add leg press next session and progress to step down training when ready.    Becky Sax, LPTA/CLT; CBIS [phone]  Juel Burrow, PTA 11/23/2022, 10:17 AM

## 2022-12-04 ENCOUNTER — Other Ambulatory Visit: Payer: Self-pay | Admitting: Orthopaedic Surgery

## 2022-12-04 ENCOUNTER — Encounter (HOSPITAL_COMMUNITY): Payer: Self-pay

## 2022-12-04 ENCOUNTER — Ambulatory Visit (HOSPITAL_COMMUNITY): Payer: Medicare PPO

## 2022-12-04 DIAGNOSIS — M25561 Pain in right knee: Secondary | ICD-10-CM

## 2022-12-04 DIAGNOSIS — R2689 Other abnormalities of gait and mobility: Secondary | ICD-10-CM | POA: Diagnosis not present

## 2022-12-04 DIAGNOSIS — M6281 Muscle weakness (generalized): Secondary | ICD-10-CM | POA: Diagnosis not present

## 2022-12-04 DIAGNOSIS — R29898 Other symptoms and signs involving the musculoskeletal system: Secondary | ICD-10-CM | POA: Diagnosis not present

## 2022-12-04 DIAGNOSIS — M25562 Pain in left knee: Secondary | ICD-10-CM | POA: Diagnosis not present

## 2022-12-04 NOTE — Therapy (Addendum)
OUTPATIENT PHYSICAL THERAPY LOWER EXTREMITY TREATMENT   Patient Name: Sherry Reed MRN: [id number] DOB:08-05-1950, 72 y.o., female Today's Date: 12/04/2022 Progress Note   Reporting Period 10/23/22 to 12/04/22   See note below for Objective Data and Assessment of Progress/Goals    END OF SESSION:  PT End of Session - 12/04/22 1002     Visit Number 5    Number of Visits 12    Date for PT Re-Evaluation 12/04/22    Authorization Type [employer]    Authorization Time Period cohere approved 1 re-eval and 12 visits from 10/23/2022-12/04/2022    Progress Note Due on Visit 10    PT Start Time 1003    PT Stop Time 1046    PT Time Calculation (min) 43 min              Past Medical History:  Diagnosis Date   Arthritis    Female cystocele    grade 2   Hearing loss    bilateral   Hypothyroidism    On thyroid replacement   Observed sleep apnea    Status post UVPP   Rectocele    grade 1   Sleep apnea    SOB (shortness of breath)    Stress incontinence    Thyroid disease    Past Surgical History:  Procedure Laterality Date   THROAT SURGERY     TONSILLECTOMY     Patient Active Problem List   Diagnosis Date Noted   Bloating 05/24/2021   Flatulence 05/24/2021   Gas bloat syndrome 08/25/2020   Fecal smearing 08/25/2020   Change in bowel habits 08/25/2020   Low back pain 02/17/2013   Right knee pain 02/17/2013   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   IUD complication (HCC) 06/24/2011   Grave's disease 06/24/2011   Osteopenia 06/24/2011    PCP: Benita Stabile MD  REFERRING PROVIDER: Kathryne Hitch, MD  REFERRING DIAG: [phone] (ICD-10-CM) - Chronic pain of right knee M25.562,G89.29 (ICD-10-CM) - Chronic pain of left knee  THERAPY DIAG:  Muscle weakness (generalized)  Pain in both knees, unspecified chronicity  Other symptoms and signs involving the musculoskeletal system  Other abnormalities of gait and mobility  Rationale  for Evaluation and Treatment: Rehabilitation  ONSET DATE: November 2023  SUBJECTIVE:   SUBJECTIVE STATEMENT: 12/04/22:  Reports she arrived back Saturday from [place].  Reports she is out of Meloxicam so increased pain Bil knees.    Eval:  Patient states knee pain with kneeling on R knee that was deep in the knee. Some pain on the outside of L knee. She is mostly concerned with leg weakness. She has not been able to water ski for about 2 years now and would love to be able to. Notes increased difficulty with cleaning, bending down, getting off the floor. No MOI and began sometime before Christmas. Doing a lot in the fall with picking up chestnuts.   PERTINENT HISTORY: Osteopenia PAIN:  Are you having pain? No  PRECAUTIONS: None  WEIGHT BEARING RESTRICTIONS: No  FALLS:  Has patient fallen in last 6 months? No  OCCUPATION: Retired  PLOF: Independent  PATIENT GOALS: get her knees stronger    OBJECTIVE:   DIAGNOSTIC FINDINGS: XRAY 5/13: R knee -moderate tricompartmental arthritis; L knee - moderate tricompartmental arthritis.   PATIENT SURVEYS: FOTO 11/06/22: FOTO 63%   12/04/22: 57.5% functional  COGNITION:Overall cognitive status: Within functional limits for tasks assessed     SENSATION: WFL   POSTURE: No  Significant postural limitations  PALPATION: No TTP  LOWER EXTREMITY ROM:  Active ROM Right eval Left eval  Hip flexion    Hip extension    Hip abduction    Hip adduction    Hip internal rotation    Hip external rotation    Knee flexion 137 138  Knee extension 0 0  Ankle dorsiflexion    Ankle plantarflexion    Ankle inversion    Ankle eversion     (Blank rows = not tested)  LOWER EXTREMITY MMT:  MMT Right eval Left eval Right 12/04/22 Left 12/04/22  Hip flexion 4+ 4+ 4/5 4/5  Hip extension 4+ 4+ 4+ 4+  Hip abduction 4 4 4/5 4/5  Hip adduction      Hip internal rotation      Hip external rotation      Knee flexion 5 5 5 5   Knee extension  5 5 5 5   Ankle dorsiflexion 5 5 5 5   Ankle plantarflexion      Ankle inversion      Ankle eversion       (Blank rows = not tested)   FUNCTIONAL TESTS:  2 minute walk test: 460 feet Stairs: 7 inch; alternating pattern, decreased eccentric control bilaterally with greater deficit on LLE Lunge too/from floor: requires UE support, labored Squat: able to complete deep squat with labored mechanics, hands on tights to return to standing  12/04/22: : 571ft no AD  GAIT: Distance walked: 460 feet Assistive device utilized: None Level of assistance: Complete Independence Comments: , intermittently slight antalgic on LLE   TODAY'S TREATMENT:                                                                                                                              DATE:  12/04/22: FOTO: 11/06/22: FOTO 63%  12/04/22: 57.5% functional 569ft Stairs ascend and descending 7in 5RT MMT (see above) Vector stance 3x 5" with 1 UE support Leg press 3x 10 slow controlled movements with 3Pl   11/23/22: Heel raise incline slope 10x Toe raise decline slope 10x Squat 10x front of chair SLS Rt 11", 16" Vector stance 3x 5" 1 HHA Sidestep 4RT GTB around thigh Forward step up 4 then 6in 10x Lateral step up 4in 10x each side March 10x 3" with finger assistance Eccentric STS 10x no HHA Tandem stance 1x 30" on foam: 1x30"   11/14/22: Heel raise 10x Toe raises 10x  Squat 10x  SLS Rt 22", Lt 11" max Abduction 10x BLE Tandem stance 2x 30" March 10x 3" with finger assistance  Sitting: STS 10x no HHA, eccentric control Supine: bridge 10x Sidelying: hip abduction 10x  11/08/22: Reviewed goals Educated importance of HEP compliance for maximal benefits FOTO 63% Standing:  Squats with cueing for mechanics front of chair 10x  SLS: Rt 15", Lt 14"  Tandem stance 2x 30" intermittent HHA Sidelying hip abduction 1 x 10 bilateral   Supine: bridges  10x   10/23/22 Squat 1 x 10  Sidelying hip  abduction 1 x 10 bilateral     PATIENT EDUCATION:  Education details: Patient educated on exam findings, POC, scope of PT, HEP. Person educated: Patient Education method: Explanation, Demonstration, and Handouts Education comprehension: verbalized understanding, returned demonstration, verbal cues required, and tactile cues required  HOME EXERCISE PROGRAM: Access Code: [email or web address] URL: [email or web address]  Date: 10/23/2022 - Squat  - 1 x daily - 7 x weekly - 2 sets - 10 reps - Sidelying Hip Abduction  - 1 x daily - 7 x weekly - 2 sets - 10 reps  Access Code: [email or web address] URL: [email or web address] Date: 11/08/2022 Prepared by: Becky Sax  Exercises - Squat  - 1 x daily - 7 x weekly - 2 sets - 10 reps - Sidelying Hip Abduction  - 1 x daily - 7 x weekly - 2 sets - 10 reps - Supine Bridge  - 1 x daily - 7 x weekly - 2 sets - 10 reps - Single Leg Stance  - 1 x daily - 7 x weekly - 3 sets - 3 reps - 30" hold  ASSESSMENT:  CLINICAL IMPRESSION: Progress note due to cert date complete with the following findings:  Pt has met 2/2 STG and progressing towards all LTGs.  Pt feels she has improved by 40% since beginning therapy.  Presents with some improvements with and 5 STS.  Continues to demonstrate hip weakness with increased weakness noted hip flexors, wonder if MMT complete same positioning.  Pt with decreased self perceived functional ability scale on FOTO.  Discussed findings, pt will continue to benefits from skilled intervention to address goals unmet, pt in agreement to continue for 4 more weeks.  Patient has met 2/2 short term goals and 0/5 long term goals with ability to complete HEP and improvement in symptoms. Patient with good progress toward remaining goals. But continues to demo weakness, functional mobility deficits and functional limitations. Extending poc for 4 additional weeks. Patient will continue to benefit from physical therapy in order  to improve function and reduce impairment.  12:24 PM, 12/12/22 Wyman Songster PT, DPT Physical Therapist at [facility]    Eval:  Patient a 72 y.o. y.o. female who was seen today for physical therapy evaluation and treatment for bilateral knee pain. Patient presents with deficits in bilateral knee strength, ROM, endurance, activity tolerance, gait, balance, and functional mobility with ADL. Patient is having to modify and restrict ADL as indicated by outcome measure score as well as subjective information and objective measures which is affecting overall participation. Patient will benefit from skilled physical therapy in order to improve function and reduce impairment.  OBJECTIVE IMPAIRMENTS: Abnormal gait, decreased activity tolerance, decreased balance, decreased endurance, decreased mobility, difficulty walking, decreased ROM, decreased strength, impaired flexibility, improper body mechanics, and pain.   ACTIVITY LIMITATIONS: carrying, lifting, bending, standing, squatting, stairs, transfers, locomotion level, and caring for others  PARTICIPATION LIMITATIONS: meal prep, cleaning, laundry, shopping, community activity, and yard work  PERSONAL FACTORS: Time since onset of injury/illness/exacerbation and 1-2 comorbidities: chronic knee pain, osteopenia  are also affecting patient's functional outcome.   REHAB POTENTIAL: Good  CLINICAL DECISION MAKING: Stable/uncomplicated  EVALUATION COMPLEXITY: Low   GOALS: Goals reviewed with patient? Yes  SHORT TERM GOALS: Target date: 11/13/2022    Patient will be independent with HEP in order to improve functional outcomes. Baseline:  12/04/22:  Reports compliance with HEP.   Goal status: MET  2.  Patient will report at least 25% improvement in symptoms for improved quality of life. Baseline: 12/04/22:  Reports improvements by 40% Goal status: MET    LONG TERM GOALS: Target date: 12/04/2022    Patient will report  at least 75% improvement in symptoms for improved quality of life. Baseline: 12/04/22:  Reports improvements by 40% Goal status: IN PROGRESS  2.  Patient will improve FOTO score to predicted outcome in order to indicate improved tolerance to activity. Baseline: 11/06/22: FOTO 63%  12/04/22: 57.5% functional Goal status: IN PROGRESS  3.  Patient will be able to navigate stairs with reciprocal pattern without compensation in order to demonstrate improved LE strength. Baseline: see above; 12/04/22:  Presents with reciprocal pattern ascending and descending, does present with some weak eccentric control descending, HR for safety Goal status: IN PROGRESS  4.  Patient will demonstrate grade of 5/5 MMT grade in all tested musculature as evidence of improved strength to assist with stair ambulation and gait. Baseline: see above; 12/04/22: see above Goal status: IN PROGRESS  5.  Patient will be able to complete advanced HEP in order to continue improving strength for participation in all ADL with ease. Baseline: 12/04/22:  Reports compliance with HEP Goal status: IN PROGRESS     PLAN:  PT FREQUENCY: 1-2x/week  PT DURATION: 6 weeks  PLANNED INTERVENTIONS: Therapeutic exercises, Therapeutic activity, Neuromuscular re-education, Balance training, Gait training, Patient/Family education, Joint manipulation, Joint mobilization, Stair training, Orthotic/Fit training, DME instructions, Aquatic Therapy, Dry Needling, Electrical stimulation, Spinal manipulation, Spinal mobilization, Cryotherapy, Moist heat, Compression bandaging, scar mobilization, Splintting, Taping, Traction, Ultrasound, Ionotophoresis 4mg /ml Dexamethasone, and Manual therapy  PLAN FOR NEXT SESSION: hip and quad strength, functional strength.   Becky Sax, LPTA/CLT; CBIS [phone]  Juel Burrow, PTA 12/04/2022, 12:33 PM

## 2022-12-12 NOTE — Addendum Note (Signed)
Addended by: Wyman Songster on: 12/12/2022 12:27 PM   Modules accepted: Orders

## 2022-12-19 ENCOUNTER — Ambulatory Visit (HOSPITAL_COMMUNITY): Payer: Medicare PPO

## 2022-12-26 ENCOUNTER — Ambulatory Visit (HOSPITAL_COMMUNITY): Payer: Medicare PPO | Attending: Orthopaedic Surgery

## 2022-12-26 DIAGNOSIS — R2689 Other abnormalities of gait and mobility: Secondary | ICD-10-CM | POA: Diagnosis not present

## 2022-12-26 DIAGNOSIS — M6281 Muscle weakness (generalized): Secondary | ICD-10-CM | POA: Diagnosis not present

## 2022-12-26 DIAGNOSIS — R29898 Other symptoms and signs involving the musculoskeletal system: Secondary | ICD-10-CM | POA: Diagnosis not present

## 2022-12-26 DIAGNOSIS — M25562 Pain in left knee: Secondary | ICD-10-CM | POA: Insufficient documentation

## 2022-12-26 DIAGNOSIS — M25561 Pain in right knee: Secondary | ICD-10-CM | POA: Insufficient documentation

## 2022-12-26 NOTE — Therapy (Signed)
OUTPATIENT PHYSICAL THERAPY LOWER EXTREMITY TREATMENT   Patient Name: Sherry Reed MRN: [id number] DOB:1950/09/01, 72 y.o., female Today's Date: 12/26/2022   END OF SESSION:  PT End of Session - 12/26/22 1036     Visit Number 6    Number of Visits 16    Date for PT Re-Evaluation 01/01/23    Authorization Type [employer]    Authorization Time Period 4 visits from 12/04/22 to 01/05/23    Authorization - Visit Number 1    Authorization - Number of Visits 4    Progress Note Due on Visit 15    PT Start Time 1035    PT Stop Time 1115    PT Time Calculation (min) 40 min    Activity Tolerance Patient tolerated treatment well    Behavior During Therapy WFL for tasks assessed/performed              Past Medical History:  Diagnosis Date   Arthritis    Female cystocele    grade 2   Hearing loss    bilateral   Hypothyroidism    On thyroid replacement   Observed sleep apnea    Status post UVPP   Rectocele    grade 1   Sleep apnea    SOB (shortness of breath)    Stress incontinence    Thyroid disease    Past Surgical History:  Procedure Laterality Date   THROAT SURGERY     TONSILLECTOMY     Patient Active Problem List   Diagnosis Date Noted   Bloating 05/24/2021   Flatulence 05/24/2021   Gas bloat syndrome 08/25/2020   Fecal smearing 08/25/2020   Change in bowel habits 08/25/2020   Low back pain 02/17/2013   Right knee pain 02/17/2013   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   IUD complication (HCC) 06/24/2011   Grave's disease 06/24/2011   Osteopenia 06/24/2011    PCP: Benita Stabile MD  REFERRING PROVIDER: Kathryne Hitch, MD  REFERRING DIAG: [phone] (ICD-10-CM) - Chronic pain of right knee M25.562,G89.29 (ICD-10-CM) - Chronic pain of left knee  THERAPY DIAG:  Muscle weakness (generalized)  Pain in both knees, unspecified chronicity  Other symptoms and signs involving the musculoskeletal system  Other abnormalities of  gait and mobility  Rationale for Evaluation and Treatment: Rehabilitation  ONSET DATE: November 2023  SUBJECTIVE:   SUBJECTIVE STATEMENT: Patient reports she has her meloxicam now and that makes a big difference.So no real pain this morning because " I haven't done anything this morning".     Eval:  Patient states knee pain with kneeling on R knee that was deep in the knee. Some pain on the outside of L knee. She is mostly concerned with leg weakness. She has not been able to water ski for about 2 years now and would love to be able to. Notes increased difficulty with cleaning, bending down, getting off the floor. No MOI and began sometime before Christmas. Doing a lot in the fall with picking up chestnuts.   PERTINENT HISTORY: Osteopenia PAIN:  Are you having pain? No  PRECAUTIONS: None  WEIGHT BEARING RESTRICTIONS: No  FALLS:  Has patient fallen in last 6 months? No  OCCUPATION: Retired  PLOF: Independent  PATIENT GOALS: get her knees stronger    OBJECTIVE:   DIAGNOSTIC FINDINGS: XRAY 5/13: R knee -moderate tricompartmental arthritis; L knee - moderate tricompartmental arthritis.   PATIENT SURVEYS: FOTO 11/06/22: FOTO 63%   12/04/22: 57.5% functional  COGNITION:Overall cognitive  status: Within functional limits for tasks assessed     SENSATION: WFL   POSTURE: No Significant postural limitations  PALPATION: No TTP  LOWER EXTREMITY ROM:  Active ROM Right eval Left eval  Hip flexion    Hip extension    Hip abduction    Hip adduction    Hip internal rotation    Hip external rotation    Knee flexion 137 138  Knee extension 0 0  Ankle dorsiflexion    Ankle plantarflexion    Ankle inversion    Ankle eversion     (Blank rows = not tested)  LOWER EXTREMITY MMT:  MMT Right eval Left eval Right 12/04/22 Left 12/04/22  Hip flexion 4+ 4+ 4/5 4/5  Hip extension 4+ 4+ 4+ 4+  Hip abduction 4 4 4/5 4/5  Hip adduction      Hip internal rotation      Hip  external rotation      Knee flexion 5 5 5 5   Knee extension 5 5 5 5   Ankle dorsiflexion 5 5 5 5   Ankle plantarflexion      Ankle inversion      Ankle eversion       (Blank rows = not tested)   FUNCTIONAL TESTS:  2 minute walk test: 460 feet Stairs: 7 inch; alternating pattern, decreased eccentric control bilaterally with greater deficit on LLE Lunge too/from floor: requires UE support, labored Squat: able to complete deep squat with labored mechanics, hands on tights to return to standing  12/04/22: : 517ft no AD  GAIT: Distance walked: 460 feet Assistive device utilized: None Level of assistance: Complete Independence Comments: , intermittently slight antalgic on LLE   TODAY'S TREATMENT:                                                                                                                              DATE:  12/26/22 Nustep seat 9 x 5' level 2 dynamic warm up Standing: Heel raises on incline x 20 Slant board 5 x 20" Toe raises on decline x 20 Hip vectors 3" x 8 each 6" step up with power up opposite leg x 10 each 6" lateral step ups x 10 each  Sit to stand with GTB around lower thighs 2 x 10     12/04/22: FOTO: 11/06/22: FOTO 63%  12/04/22: 57.5% functional 562ft Stairs ascend and descending 7in 5RT MMT (see above) Vector stance 3x 5" with 1 UE support Leg press 3x 10 slow controlled movements with 3Pl   11/23/22: Heel raise incline slope 10x Toe raise decline slope 10x Squat 10x front of chair SLS Rt 11", 16" Vector stance 3x 5" 1 HHA Sidestep 4RT GTB around thigh Forward step up 4 then 6in 10x Lateral step up 4in 10x each side March 10x 3" with finger assistance Eccentric STS 10x no HHA Tandem stance 1x 30" on foam: 1x30"   11/14/22: Heel raise 10x Toe raises 10x  Squat 10x  SLS Rt 22", Lt 11" max Abduction 10x BLE Tandem stance 2x 30" March 10x 3" with finger assistance  Sitting: STS 10x no HHA, eccentric control Supine:  bridge 10x Sidelying: hip abduction 10x  11/08/22: Reviewed goals Educated importance of HEP compliance for maximal benefits FOTO 63% Standing:  Squats with cueing for mechanics front of chair 10x  SLS: Rt 15", Lt 14"  Tandem stance 2x 30" intermittent HHA Sidelying hip abduction 1 x 10 bilateral   Supine: bridges 10x   10/23/22 Squat 1 x 10  Sidelying hip abduction 1 x 10 bilateral     PATIENT EDUCATION:  Education details: Patient educated on exam findings, POC, scope of PT, HEP. Person educated: Patient Education method: Explanation, Demonstration, and Handouts Education comprehension: verbalized understanding, returned demonstration, verbal cues required, and tactile cues required  HOME EXERCISE PROGRAM: 12/26/22 sit to stand with theraband around knees/thighs  Access Code: [email or web address] URL: [email or web address]  Date: 10/23/2022 - Squat  - 1 x daily - 7 x weekly - 2 sets - 10 reps - Sidelying Hip Abduction  - 1 x daily - 7 x weekly - 2 sets - 10 reps  Access Code: [email or web address] URL: [email or web address] Date: 11/08/2022 Prepared by: Becky Sax  Exercises - Squat  - 1 x daily - 7 x weekly - 2 sets - 10 reps - Sidelying Hip Abduction  - 1 x daily - 7 x weekly - 2 sets - 10 reps - Supine Bridge  - 1 x daily - 7 x weekly - 2 sets - 10 reps - Single Leg Stance  - 1 x daily - 7 x weekly - 3 sets - 3 reps - 30" hold  ASSESSMENT:  CLINICAL IMPRESSION: Today's session started with Nustep for dynamic warm up.  Needed cues for controlled descent with sit to stand.  Added power up with opposite leg with step ups for increased challenge.  Patient without complaint of pain with treatment today.  Patient will benefit from continued skilled therapy services to address deficits and promote return to optimal function.       Patient has met 2/2 short term goals and 0/5 long term goals with ability to complete HEP and improvement in symptoms. Patient with  good progress toward remaining goals. But continues to demo weakness, functional mobility deficits and functional limitations. Extending poc for 4 additional weeks. Patient will continue to benefit from physical therapy in order to improve function and reduce impairment.    Eval:  Patient a 72 y.o. y.o. female who was seen today for physical therapy evaluation and treatment for bilateral knee pain. Patient presents with deficits in bilateral knee strength, ROM, endurance, activity tolerance, gait, balance, and functional mobility with ADL. Patient is having to modify and restrict ADL as indicated by outcome measure score as well as subjective information and objective measures which is affecting overall participation. Patient will benefit from skilled physical therapy in order to improve function and reduce impairment.  OBJECTIVE IMPAIRMENTS: Abnormal gait, decreased activity tolerance, decreased balance, decreased endurance, decreased mobility, difficulty walking, decreased ROM, decreased strength, impaired flexibility, improper body mechanics, and pain.   ACTIVITY LIMITATIONS: carrying, lifting, bending, standing, squatting, stairs, transfers, locomotion level, and caring for others  PARTICIPATION LIMITATIONS: meal prep, cleaning, laundry, shopping, community activity, and yard work  PERSONAL FACTORS: Time since onset of injury/illness/exacerbation and 1-2 comorbidities: chronic knee pain, osteopenia  are also affecting patient's functional outcome.   REHAB POTENTIAL: Good  CLINICAL DECISION MAKING: Stable/uncomplicated  EVALUATION  COMPLEXITY: Low   GOALS: Goals reviewed with patient? Yes  SHORT TERM GOALS: Target date: 11/13/2022    Patient will be independent with HEP in order to improve functional outcomes. Baseline:  12/04/22:  Reports compliance with HEP.   Goal status: MET  2.  Patient will report at least 25% improvement in symptoms for improved quality of life. Baseline: 12/04/22:   Reports improvements by 40% Goal status: MET    LONG TERM GOALS: Target date: 12/04/2022    Patient will report at least 75% improvement in symptoms for improved quality of life. Baseline: 12/04/22:  Reports improvements by 40% Goal status: IN PROGRESS  2.  Patient will improve FOTO score to predicted outcome in order to indicate improved tolerance to activity. Baseline: 11/06/22: FOTO 63%  12/04/22: 57.5% functional Goal status: IN PROGRESS  3.  Patient will be able to navigate stairs with reciprocal pattern without compensation in order to demonstrate improved LE strength. Baseline: see above; 12/04/22:  Presents with reciprocal pattern ascending and descending, does present with some weak eccentric control descending, HR for safety Goal status: IN PROGRESS  4.  Patient will demonstrate grade of 5/5 MMT grade in all tested musculature as evidence of improved strength to assist with stair ambulation and gait. Baseline: see above; 12/04/22: see above Goal status: IN PROGRESS  5.  Patient will be able to complete advanced HEP in order to continue improving strength for participation in all ADL with ease. Baseline: 12/04/22:  Reports compliance with HEP Goal status: IN PROGRESS     PLAN:  PT FREQUENCY: 1-2x/week  PT DURATION: 6 weeks  PLANNED INTERVENTIONS: Therapeutic exercises, Therapeutic activity, Neuromuscular re-education, Balance training, Gait training, Patient/Family education, Joint manipulation, Joint mobilization, Stair training, Orthotic/Fit training, DME instructions, Aquatic Therapy, Dry Needling, Electrical stimulation, Spinal manipulation, Spinal mobilization, Cryotherapy, Moist heat, Compression bandaging, scar mobilization, Splintting, Taping, Traction, Ultrasound, Ionotophoresis 4mg /ml Dexamethasone, and Manual therapy  PLAN FOR NEXT SESSION: hip and quad strength, functional strength. Reassess next visit  11:18 AM, 12/26/22 Britney Newstrom Small Tauren Delbuono MPT [place]  [facility] [place] [phone]

## 2022-12-27 ENCOUNTER — Ambulatory Visit: Payer: Medicare PPO | Admitting: Orthopaedic Surgery

## 2022-12-27 ENCOUNTER — Encounter: Payer: Self-pay | Admitting: Orthopaedic Surgery

## 2022-12-27 DIAGNOSIS — M25561 Pain in right knee: Secondary | ICD-10-CM

## 2022-12-27 DIAGNOSIS — G8929 Other chronic pain: Secondary | ICD-10-CM

## 2022-12-27 NOTE — Progress Notes (Signed)
The patient comes in for follow-up after having physical therapy for her knees.  She is 72 years old and very active.  She was having a lot of IT band pain around the right knee and just weakness with the left knee.  She says her pain is minimal but she does take meloxicam and that is very helpful for her.  She has been very pleased with physical therapy.  On exam both knees move smoothly and fluidly.  She does have some slight more weakness on her quads on the left than the right.  There is no effusion of either knee.  Both hips move smoothly.  She understands that a home exercise program is worth staying on and certainly meloxicam is done well for her so she can continue this as needed.  All questions and concerns were answered addressed.  Follow-up is as needed.

## 2023-01-04 ENCOUNTER — Ambulatory Visit (HOSPITAL_COMMUNITY): Payer: Medicare PPO

## 2023-01-04 ENCOUNTER — Encounter (HOSPITAL_COMMUNITY): Payer: Self-pay

## 2023-01-04 DIAGNOSIS — M25562 Pain in left knee: Secondary | ICD-10-CM | POA: Diagnosis not present

## 2023-01-04 DIAGNOSIS — M6281 Muscle weakness (generalized): Secondary | ICD-10-CM | POA: Diagnosis not present

## 2023-01-04 DIAGNOSIS — R2689 Other abnormalities of gait and mobility: Secondary | ICD-10-CM

## 2023-01-04 DIAGNOSIS — M25561 Pain in right knee: Secondary | ICD-10-CM

## 2023-01-04 DIAGNOSIS — R29898 Other symptoms and signs involving the musculoskeletal system: Secondary | ICD-10-CM

## 2023-01-04 NOTE — Therapy (Signed)
OUTPATIENT PHYSICAL THERAPY LOWER EXTREMITY TREATMENT   Patient Name: Sherry Reed MRN: [id number] DOB:February 22, 1951, 72 y.o., female Today's Date: 01/04/2023   END OF SESSION:  PT End of Session - 01/04/23 1005     Visit Number 7    Number of Visits 16    Date for PT Re-Evaluation 01/01/23    Authorization Type [employer]    Authorization Time Period 4 visits from 12/04/22 to 01/05/23    Authorization - Visit Number 2    Authorization - Number of Visits 4    Progress Note Due on Visit 15    PT Start Time 1005    PT Stop Time 1043    PT Time Calculation (min) 38 min    Activity Tolerance Patient tolerated treatment well    Behavior During Therapy WFL for tasks assessed/performed              Past Medical History:  Diagnosis Date   Arthritis    Female cystocele    grade 2   Hearing loss    bilateral   Hypothyroidism    On thyroid replacement   Observed sleep apnea    Status post UVPP   Rectocele    grade 1   Sleep apnea    SOB (shortness of breath)    Stress incontinence    Thyroid disease    Past Surgical History:  Procedure Laterality Date   THROAT SURGERY     TONSILLECTOMY     Patient Active Problem List   Diagnosis Date Noted   Bloating 05/24/2021   Flatulence 05/24/2021   Gas bloat syndrome 08/25/2020   Fecal smearing 08/25/2020   Change in bowel habits 08/25/2020   Low back pain 02/17/2013   Right knee pain 02/17/2013   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   IUD complication (HCC) 06/24/2011   Grave's disease 06/24/2011   Osteopenia 06/24/2011    PCP: Benita Stabile MD  REFERRING PROVIDER: Kathryne Hitch, MD  REFERRING DIAG: [phone] (ICD-10-CM) - Chronic pain of right knee M25.562,G89.29 (ICD-10-CM) - Chronic pain of left knee  THERAPY DIAG:  Muscle weakness (generalized)  Pain in both knees, unspecified chronicity  Other symptoms and signs involving the musculoskeletal system  Other abnormalities of  gait and mobility  Rationale for Evaluation and Treatment: Rehabilitation  ONSET DATE: November 2023  SUBJECTIVE:   SUBJECTIVE STATEMENT: Pt reports she is feeling good today, no reports of pain.  Feels she has improved by 40-50%.  Reports she hasn't been as compliant with HEP due to busy schedule lately.  Reports her MD is happy with progress.     Eval:  Patient states knee pain with kneeling on R knee that was deep in the knee. Some pain on the outside of L knee. She is mostly concerned with leg weakness. She has not been able to water ski for about 2 years now and would love to be able to. Notes increased difficulty with cleaning, bending down, getting off the floor. No MOI and began sometime before Christmas. Doing a lot in the fall with picking up chestnuts.   PERTINENT HISTORY: Osteopenia PAIN:  Are you having pain? No  PRECAUTIONS: None  WEIGHT BEARING RESTRICTIONS: No  FALLS:  Has patient fallen in last 6 months? No  OCCUPATION: Retired  PLOF: Independent  PATIENT GOALS: get her knees stronger    OBJECTIVE:   DIAGNOSTIC FINDINGS: XRAY 5/13: R knee -moderate tricompartmental arthritis; L knee - moderate tricompartmental arthritis.  PATIENT SURVEYS: FOTO 11/06/22: FOTO 63%   12/04/22: 57.5% functional; 01/04/23: 65%  COGNITION:Overall cognitive status: Within functional limits for tasks assessed     SENSATION: WFL   POSTURE: No Significant postural limitations  PALPATION: No TTP  LOWER EXTREMITY ROM:  Active ROM Right eval Left eval  Hip flexion    Hip extension    Hip abduction    Hip adduction    Hip internal rotation    Hip external rotation    Knee flexion 137 138  Knee extension 0 0  Ankle dorsiflexion    Ankle plantarflexion    Ankle inversion    Ankle eversion     (Blank rows = not tested)  LOWER EXTREMITY MMT:  MMT Right eval Left eval Right 12/04/22 Left 12/04/22 Right 01/04/23 Left 01/04/23  Hip flexion 4+ 4+ 4/5 4/5 4+ 4+  Hip  extension 4+ 4+ 4+ 4+ 4+ 5/5  Hip abduction 4 4 4/5 4/5 5/5 5/5  Hip adduction        Hip internal rotation        Hip external rotation        Knee flexion 5 5 5 5     Knee extension 5 5 5 5     Ankle dorsiflexion 5 5 5 5     Ankle plantarflexion        Ankle inversion        Ankle eversion         (Blank rows = not tested)   FUNCTIONAL TESTS:  2 minute walk test: 460 feet Stairs: 7 inch; alternating pattern, decreased eccentric control bilaterally with greater deficit on LLE Lunge too/from floor: requires UE support, labored Squat: able to complete deep squat with labored mechanics, hands on tights to return to standing  12/04/22: : 530ft no AD 01/04/23: : 627ft no AD  GAIT: Distance walked: 460 feet Assistive device utilized: None Level of assistance: Complete Independence Comments: , intermittently slight antalgic on LLE   TODAY'S TREATMENT:                                                                                                                              DATE:  01/04/23: Reviewed goals- feels she has improved by 40-50% FOTO 65% MMT Reciprocal pattern 7in step height 5RT no HHA 615ft Reviewed HEP to continue at home  Tandem stance  Vector stance  STS with GTB around thigh  Sidestep 2RT down long hallway with GTB around thigh  Squats  SLS  12/26/22 Nustep seat 9 x 5' level 2 dynamic warm up Standing: Heel raises on incline x 20 Slant board 5 x 20" Toe raises on decline x 20 Hip vectors 3" x 8 each 6" step up with power up opposite leg x 10 each 6" lateral step ups x 10 each  Sit to stand with GTB around lower thighs 2 x 10     12/04/22: FOTO: 11/06/22: FOTO 63%  12/04/22: 57.5%  functional 530ft Stairs ascend and descending 7in 5RT MMT (see above) Vector stance 3x 5" with 1 UE support Leg press 3x 10 slow controlled movements with 3Pl   11/23/22: Heel raise incline slope 10x Toe raise decline slope 10x Squat 10x front of  chair SLS Rt 11", 16" Vector stance 3x 5" 1 HHA Sidestep 4RT GTB around thigh Forward step up 4 then 6in 10x Lateral step up 4in 10x each side March 10x 3" with finger assistance Eccentric STS 10x no HHA Tandem stance 1x 30" on foam: 1x30"   11/14/22: Heel raise 10x Toe raises 10x  Squat 10x  SLS Rt 22", Lt 11" max Abduction 10x BLE Tandem stance 2x 30" March 10x 3" with finger assistance  Sitting: STS 10x no HHA, eccentric control Supine: bridge 10x Sidelying: hip abduction 10x  11/08/22: Reviewed goals Educated importance of HEP compliance for maximal benefits FOTO 63% Standing:  Squats with cueing for mechanics front of chair 10x  SLS: Rt 15", Lt 14"  Tandem stance 2x 30" intermittent HHA Sidelying hip abduction 1 x 10 bilateral   Supine: bridges 10x   10/23/22 Squat 1 x 10  Sidelying hip abduction 1 x 10 bilateral     PATIENT EDUCATION:  Education details: Patient educated on exam findings, POC, scope of PT, HEP. Person educated: Patient Education method: Explanation, Demonstration, and Handouts Education comprehension: verbalized understanding, returned demonstration, verbal cues required, and tactile cues required  HOME EXERCISE PROGRAM: 01/04/23:  sidestep with GTB   12/26/22 sit to stand with theraband around knees/thighs  Access Code: [email or web address] URL: [email or web address]  Date: 10/23/2022 - Squat  - 1 x daily - 7 x weekly - 2 sets - 10 reps - Sidelying Hip Abduction  - 1 x daily - 7 x weekly - 2 sets - 10 reps  Access Code: [email or web address] URL: [email or web address] Date: 11/08/2022 Prepared by: Becky Sax  Exercises - Squat  - 1 x daily - 7 x weekly - 2 sets - 10 reps - Sidelying Hip Abduction  - 1 x daily - 7 x weekly - 2 sets - 10 reps - Supine Bridge  - 1 x daily - 7 x weekly - 2 sets - 10 reps - Single Leg Stance  - 1 x daily - 7 x weekly - 3 sets - 3 reps - 30" hold  ASSESSMENT:  CLINICAL IMPRESSION: Progress  note per date with the following findings:  Pt has met 2/2 STG and 3/5 LTGs.  Presents with increased gluteal strength and increased self perceived functional abilities noted FOTO.  Reviewed current HEP to continue at home.  DC to HEP.   Eval:  Patient a 72 y.o. y.o. female who was seen today for physical therapy evaluation and treatment for bilateral knee pain. Patient presents with deficits in bilateral knee strength, ROM, endurance, activity tolerance, gait, balance, and functional mobility with ADL. Patient is having to modify and restrict ADL as indicated by outcome measure score as well as subjective information and objective measures which is affecting overall participation. Patient will benefit from skilled physical therapy in order to improve function and reduce impairment.  OBJECTIVE IMPAIRMENTS: Abnormal gait, decreased activity tolerance, decreased balance, decreased endurance, decreased mobility, difficulty walking, decreased ROM, decreased strength, impaired flexibility, improper body mechanics, and pain.   ACTIVITY LIMITATIONS: carrying, lifting, bending, standing, squatting, stairs, transfers, locomotion level, and caring for others  PARTICIPATION LIMITATIONS: meal prep, cleaning, laundry, shopping, community activity, and yard work  PERSONAL FACTORS: Time since onset of  injury/illness/exacerbation and 1-2 comorbidities: chronic knee pain, osteopenia  are also affecting patient's functional outcome.   REHAB POTENTIAL: Good  CLINICAL DECISION MAKING: Stable/uncomplicated  EVALUATION COMPLEXITY: Low   GOALS: Goals reviewed with patient? Yes  SHORT TERM GOALS: Target date: 11/13/2022    Patient will be independent with HEP in order to improve functional outcomes. Baseline:  12/04/22:  Reports compliance with HEP.   Goal status: MET  2.  Patient will report at least 25% improvement in symptoms for improved quality of life. Baseline: 12/04/22:  Reports improvements by 40% Goal  status: MET    LONG TERM GOALS: Target date: 12/04/2022    Patient will report at least 75% improvement in symptoms for improved quality of life. Baseline: 12/04/22:  Reports improvements by 40%; 01/03/22:  Feels she has improved by 40-50% Goal status: IN PROGRESS  2.  Patient will improve FOTO score to predicted outcome in order to indicate improved tolerance to activity. Baseline: 11/06/22: FOTO 63%  12/04/22: 57.5% functional; 01/04/23:  65% functional (FOTO predicated 67%) Goal status: IN PROGRESS  3.  Patient will be able to navigate stairs with reciprocal pattern without compensation in order to demonstrate improved LE strength. Baseline: see above; 12/04/22:  Presents with reciprocal pattern ascending and descending, does present with some weak eccentric control descending, HR for safety Goal status: MET  4.  Patient will demonstrate grade of 5/5 MMT grade in all tested musculature as evidence of improved strength to assist with stair ambulation and gait. Baseline: see above; 12/04/22: see above Goal status: 01/04/23:  partially met  5.  Patient will be able to complete advanced HEP in order to continue improving strength for participation in all ADL with ease. Baseline: 12/04/22:  Reports compliance with HEP Goal status: MET     PLAN:  PT FREQUENCY: 1-2x/week  PT DURATION: 6 weeks  PLANNED INTERVENTIONS: Therapeutic exercises, Therapeutic activity, Neuromuscular re-education, Balance training, Gait training, Patient/Family education, Joint manipulation, Joint mobilization, Stair training, Orthotic/Fit training, DME instructions, Aquatic Therapy, Dry Needling, Electrical stimulation, Spinal manipulation, Spinal mobilization, Cryotherapy, Moist heat, Compression bandaging, scar mobilization, Splintting, Taping, Traction, Ultrasound, Ionotophoresis 4mg /ml Dexamethasone, and Manual therapy  PLAN FOR NEXT SESSION: DC to HEP.  Becky Sax, LPTA/CLT;  CBIS [phone]  Juel Burrow, PTA 01/04/2023, 12:55 PM  12:52 PM, 01/04/23

## 2023-02-08 ENCOUNTER — Encounter (HOSPITAL_BASED_OUTPATIENT_CLINIC_OR_DEPARTMENT_OTHER): Payer: Self-pay | Admitting: Obstetrics and Gynecology

## 2023-02-12 ENCOUNTER — Encounter (HOSPITAL_BASED_OUTPATIENT_CLINIC_OR_DEPARTMENT_OTHER): Payer: Self-pay | Admitting: Obstetrics and Gynecology

## 2023-02-12 ENCOUNTER — Other Ambulatory Visit: Payer: Self-pay

## 2023-02-12 DIAGNOSIS — N8110 Cystocele, unspecified: Secondary | ICD-10-CM | POA: Diagnosis not present

## 2023-02-12 DIAGNOSIS — Z01818 Encounter for other preprocedural examination: Secondary | ICD-10-CM | POA: Diagnosis present

## 2023-02-12 DIAGNOSIS — Z01812 Encounter for preprocedural laboratory examination: Secondary | ICD-10-CM | POA: Diagnosis not present

## 2023-02-12 DIAGNOSIS — N816 Rectocele: Secondary | ICD-10-CM | POA: Diagnosis not present

## 2023-02-12 NOTE — Progress Notes (Signed)
Spoke w/ via phone for pre-op interview---[facility] Lab needs dos----  none per anesthesia, surgeon orders pending       Lab results------02/20/2023 lab appt for cbc, type & screen COVID test -----patient states asymptomatic no test needed Arrive at -------1030 on Thursday, 02/22/23 NPO after MN NO Solid Food.  Clear liquids from MN until---0930 Med rec completed Medications to take morning of surgery -----Synthroid, Myrbetriq, Solifenacin Diabetic medication -----n/a Patient instructed no nail polish to be worn day of surgery Patient instructed to bring photo id and insurance card day of surgery Patient aware to have Driver (ride ) / caregiver    for 24 hours after surgery - husband, Tammy Sours Patient Special Instructions -----Extended / overnight stay instructions given. Pre-Op special Instructions -----Requested orders from Dr. Estanislado Pandy via Epic IB on 02/08/2023. Patient wears glasses and bilateral hearing aids. Patient verbalized understanding of instructions that were given at this phone interview. Patient denies chest pain, sob, fever, cough at the interview.

## 2023-02-12 NOTE — Progress Notes (Signed)
Your procedure is scheduled on Thursday, 02/22/23.  Report to [facility] AT  10:30 AM.   Call this number if you have problems the morning of surgery  :[phone].   OUR ADDRESS IS [address].  WE ARE LOCATED IN [facility].  PLEASE BRING YOUR INSURANCE CARD AND PHOTO ID DAY OF SURGERY.  ONLY 2 PEOPLE ARE ALLOWED IN  WAITING  ROOM                                      REMEMBER:  DO NOT EAT FOOD, CANDY GUM OR MINTS  AFTER MIDNIGHT THE NIGHT BEFORE YOUR SURGERY . YOU MAY HAVE CLEAR LIQUIDS FROM MIDNIGHT THE NIGHT BEFORE YOUR SURGERY UNTIL  9:30 AM. NO CLEAR LIQUIDS AFTER   9:30 AM DAY OF SURGERY.  YOU MAY  BRUSH YOUR TEETH MORNING OF SURGERY AND RINSE YOUR MOUTH OUT, NO CHEWING GUM CANDY OR MINTS.     CLEAR LIQUID DIET    Allowed      Water                                                                   Coffee and tea, regular and decaf  (NO cream or milk products of any type, may sweeten)                         Carbonated beverages, regular and diet                                    Sports drinks like Gatorade _____________________________________________________________________     TAKE ONLY THESE MEDICATIONS MORNING OF SURGERY: Levothyroxine, Solifenacin, Mybetriq                                        DO NOT WEAR JEWERLY/  METAL/  PIERCINGS (INCLUDING NO PLASTIC PIERCINGS) DO NOT WEAR LOTIONS, POWDERS, PERFUMES OR NAIL POLISH ON YOUR FINGERNAILS. TOENAIL POLISH IS OK TO WEAR. DO NOT SHAVE FOR 48 HOURS PRIOR TO DAY OF SURGERY.  CONTACTS, GLASSES, OR DENTURES MAY NOT BE WORN TO SURGERY.  REMEMBER: NO SMOKING, VAPING ,  DRUGS OR ALCOHOL FOR 24 HOURS BEFORE YOUR SURGERY.                                    S[facility] IS NOT RESPONSIBLE  FOR ANY BELONGINGS.                                                                    Sherry Reed Kitchen           Sherry Reed - Preparing for Surgery  Before surgery, you can play an important role.   Because skin is not sterile, your skin needs to be as free of germs as possible.  You can reduce the number of germs on your skin by washing with CHG (chlorahexidine gluconate) soap before surgery.  CHG is an antiseptic cleaner which kills germs and bonds with the skin to continue killing germs even after washing. Please DO NOT use if you have an allergy to CHG or antibacterial soaps.  If your skin becomes reddened/irritated stop using the CHG and inform your nurse when you arrive at Short Stay. Do not shave (including legs and underarms) for at least 48 hours prior to the first CHG shower.  You may shave your face/neck. Please follow these instructions carefully:  1.  Shower with CHG Soap the night before surgery and the  morning of Surgery.  2.  If you choose to wash your hair, wash your hair first as usual with your  normal  shampoo.  3.  After you shampoo, rinse your hair and body thoroughly to remove the  shampoo.                                        4.  Use CHG as you would any other liquid soap.  You can apply chg directly  to the skin and wash , chg soap provided, night before and morning of your surgery.  5.  Apply the CHG Soap to your body ONLY FROM THE NECK DOWN.   Do not use on face/ open                           Wound or open sores. Avoid contact with eyes, ears mouth and genitals (private parts).                       Wash face,  Genitals (private parts) with your normal soap.             6.  Wash thoroughly, paying special attention to the area where your surgery  will be performed.  7.  Thoroughly rinse your body with warm water from the neck down.  8.  DO NOT shower/wash with your normal soap after using and rinsing off  the CHG Soap.             9.  Pat yourself dry with a clean towel.            10.  Wear clean pajamas.            11.  Place clean sheets on your bed the night of your first shower and do not  sleep with pets. Day of Surgery : Do not apply any lotions/ powders  the morning of surgery.  Please wear clean clothes to the hospital/surgery center.  IF YOU HAVE ANY SKIN IRRITATION OR PROBLEMS WITH THE SURGICAL SOAP, PLEASE GET A BAR OF GOLD DIAL SOAP AND SHOWER THE NIGHT BEFORE YOUR SURGERY AND THE MORNING OF YOUR SURGERY. PLEASE LET THE NURSE KNOW MORNING OF YOUR SURGERY IF YOU HAD ANY PROBLEMS WITH THE SURGICAL SOAP.   YOUR SURGEON MAY HAVE REQUESTED EXTENDED RECOVERY TIME AFTER YOUR SURGERY. IT COULD BE A  JUST A FEW HOURS  UP TO AN OVERNIGHT STAY.  YOUR SURGEON SHOULD HAVE DISCUSSED THIS WITH YOU PRIOR TO  YOUR SURGERY. IN THE EVENT YOU NEED TO STAY OVERNIGHT PLEASE REFER TO THE FOLLOWING GUIDELINES. YOU MAY HAVE UP TO 4 VISITORS  MAY VISIT IN THE EXTENDED RECOVERY ROOM UNTIL 800 PM ONLY.  ONE  VISITOR AGE 44 AND OVER MAY SPEND THE NIGHT AND MUST BE IN EXTENDED RECOVERY ROOM NO LATER THAN 800 PM . YOUR DISCHARGE TIME AFTER YOU SPEND THE NIGHT IS 900 AM THE MORNING AFTER YOUR SURGERY. YOU MAY PACK A SMALL OVERNIGHT BAG WITH TOILETRIES FOR YOUR OVERNIGHT STAY IF YOU WISH.  REGARDLESS OF IF YOU STAY OVER NIGHT OR ARE DISCHARGED THE SAME DAY YOU WILL BE REQUIRED TO HAVE A RESPONSIBLE ADULT (18 YRS OLD OR OLDER) STAY WITH YOU FOR AT LEAST THE FIRST 24 HOURS  YOUR PRESCRIPTION MEDICATIONS WILL BE PROVIDED DURING YOUR HOSPITAL STAY.  ________________________________________________________________________                                                        QUESTIONS Sherry Reed PRE OP NURSE PHONE [phone].

## 2023-02-13 NOTE — H&P (Signed)
Sherry Reed is a 72 YO female, P: 2-0-0-2, who presents for hysterectomy and anterior-posterior colporrhaphy because of symptomatic pelvic prolapse. For the past 4 years the patient reports feeling a bulge at her vaginal opening, problems completely emptying her bladder, leaking of urine and inability to stop urine flow. She denies any pelvic pain, hematuria, dysuria, nocturia, or problems with bowel function. She has been taking medications for overactive bladder with minimal relief of some of her symptoms however, she wishes to proceed with definitive therapy in the form or surgery to remove her uterus and repair her vaginal prolapse.  Past Medical History  OB History:  G: 2;  P: 2-0-0-2;  SVB 1977 and 1980 with largest infant weighing 8 lbs.  GYN History: menarche: 72 YO;    LMP: menopause;   Denies history of abnormal PAP smear.   Last PAP smear: 2021 normal  Medical History: Graves Disease, IBS, Osteopenia, Vitamin D Deficiency, Bilateral Sensorineural Hearing Loss, Hyperlipidemia, Overactive Bladder and Sleep Apnea (resolved)   Surgical History: 1977 Laparoscopy to remove dislodged IUD Denies problems with anesthesia or history of blood transfusions  Family History: Diabetes Mellitus, Hypertension, Dementia (related to trauma) and Breast Cancer  Social History:  Married and retired;  Denies tobacco use and rarely uses alcohol   Medications: Levothyroxine 100 mcg daily Meloxicam 7.5 mg daily prn Estradiol Vaginal Cream 0.01% 1 gram in vagina twice weekly Myrbetriq 50 mg daily Vesicare 5 mg daily  No Known Allergies  Denies sensitivity to peanuts, shellfish, soy, latex or adhesives.    ROS: Admits to glasses, tinnitus, hearing loss, occasional diarrhea/constipation, urinary urgency, frequency and left knee weakness but   denies headache, vision changes, nasal congestion, dysphagia, dizziness, hoarseness, cough,  chest pain, shortness of breath, nausea, vomiting, dysuria,  hematuria, vaginitis symptoms, pelvic pain, swelling of joints,easy bruising,  myalgias, arthralgias, skin rashes, unexplained weight loss and except as is mentioned in the history of present illness, patient's review of systems is otherwise negative.   Physical Exam  Bp: 140/88;  Weight:160 lbs.; Height: 5\' 5" ;  BMI: 26.7  Neck: supple without masses or thyromegaly Lungs: clear to auscultation Heart: regular rate and rhythm Abdomen: soft, non-tender and no organomegaly Pelvic:EGBUS- wnl; vagina with moderate atrophy, 3/4 cystocele and 2/4 rectocele; uterus-normal size, cervix without lesions or motion tenderness; adnexae-no tenderness or masses Extremities:  no clubbing, cyanosis or edema   Assesment: Pelvic Prolapse                      Cystocele                      Rectocele   Disposition:  Reviewed the risks of surgery to include, but not limited to: reaction to anesthesia, damage to adjacent organs, infection and excessive bleeding. The patient verbalized understanding of these risks and has consented to proceed with a Total Vaginal Hysterectomy with Bilateral Salpingectomy and Anterior-Posterior Colporrhaphy at [facility] on February 22, 2023.  CSN# [id number]   Jianna Drabik J. Lowell Guitar, PA-C  for Dr. Crist Fat. Rivard

## 2023-02-16 NOTE — Progress Notes (Signed)
Patient called in and left a message with a few questions. I called her back and reviewed what medications she could take the morning of surgery and what she might want to bring in her overnight bag.

## 2023-02-20 ENCOUNTER — Encounter (HOSPITAL_COMMUNITY)
Admission: RE | Admit: 2023-02-20 | Discharge: 2023-02-20 | Disposition: A | Discharge status: Home / Self Care | Payer: Medicare PPO | Source: Ambulatory Visit | Attending: Obstetrics and Gynecology

## 2023-02-20 DIAGNOSIS — Z01812 Encounter for preprocedural laboratory examination: Secondary | ICD-10-CM | POA: Diagnosis not present

## 2023-02-20 DIAGNOSIS — N816 Rectocele: Secondary | ICD-10-CM | POA: Insufficient documentation

## 2023-02-20 DIAGNOSIS — Z01818 Encounter for other preprocedural examination: Secondary | ICD-10-CM

## 2023-02-20 DIAGNOSIS — N8110 Cystocele, unspecified: Secondary | ICD-10-CM | POA: Insufficient documentation

## 2023-02-20 LAB — CBC
HCT: 39.6 % (ref 36.0–46.0)
Hemoglobin: 12.8 g/dL (ref 12.0–15.0)
MCH: 31 pg (ref 26.0–34.0)
MCHC: 32.3 g/dL (ref 30.0–36.0)
MCV: 95.9 fL (ref 80.0–100.0)
Platelets: 202 10*3/uL (ref 150–400)
RBC: 4.13 MIL/uL (ref 3.87–5.11)
RDW: 13 % (ref 11.5–15.5)
WBC: 5.6 10*3/uL (ref 4.0–10.5)
nRBC: 0 % (ref 0.0–0.2)

## 2023-02-22 ENCOUNTER — Ambulatory Visit (HOSPITAL_BASED_OUTPATIENT_CLINIC_OR_DEPARTMENT_OTHER): Payer: Medicare PPO | Admitting: Certified Registered Nurse Anesthetist

## 2023-02-22 ENCOUNTER — Encounter (HOSPITAL_BASED_OUTPATIENT_CLINIC_OR_DEPARTMENT_OTHER): Payer: Self-pay | Admitting: Obstetrics and Gynecology

## 2023-02-22 ENCOUNTER — Ambulatory Visit (HOSPITAL_BASED_OUTPATIENT_CLINIC_OR_DEPARTMENT_OTHER)
Admission: RE | Admit: 2023-02-22 | Discharge: 2023-02-23 | Disposition: A | Discharge status: Home / Self Care | Payer: Medicare PPO | Attending: Obstetrics and Gynecology | Admitting: Obstetrics and Gynecology

## 2023-02-22 ENCOUNTER — Other Ambulatory Visit: Payer: Self-pay

## 2023-02-22 ENCOUNTER — Encounter (HOSPITAL_BASED_OUTPATIENT_CLINIC_OR_DEPARTMENT_OTHER)
Admission: RE | Disposition: A | Discharge status: Home / Self Care | Payer: Self-pay | Source: Home / Self Care | Attending: Obstetrics and Gynecology

## 2023-02-22 DIAGNOSIS — N816 Rectocele: Secondary | ICD-10-CM | POA: Diagnosis not present

## 2023-02-22 DIAGNOSIS — N8003 Adenomyosis of the uterus: Secondary | ICD-10-CM | POA: Diagnosis not present

## 2023-02-22 DIAGNOSIS — N888 Other specified noninflammatory disorders of cervix uteri: Secondary | ICD-10-CM | POA: Insufficient documentation

## 2023-02-22 DIAGNOSIS — N819 Female genital prolapse, unspecified: Secondary | ICD-10-CM

## 2023-02-22 DIAGNOSIS — Z01818 Encounter for other preprocedural examination: Secondary | ICD-10-CM

## 2023-02-22 DIAGNOSIS — N8189 Other female genital prolapse: Secondary | ICD-10-CM | POA: Diagnosis not present

## 2023-02-22 DIAGNOSIS — N8110 Cystocele, unspecified: Secondary | ICD-10-CM | POA: Insufficient documentation

## 2023-02-22 DIAGNOSIS — D259 Leiomyoma of uterus, unspecified: Secondary | ICD-10-CM | POA: Diagnosis not present

## 2023-02-22 HISTORY — DX: Presence of spectacles and contact lenses: Z97.3

## 2023-02-22 HISTORY — DX: Presence of external hearing-aid: Z97.4

## 2023-02-22 HISTORY — PX: VAGINAL HYSTERECTOMY: SHX2639

## 2023-02-22 HISTORY — DX: Pneumonia, unspecified organism: J18.9

## 2023-02-22 HISTORY — DX: Other chronic pain: G89.29

## 2023-02-22 HISTORY — DX: Irritable bowel syndrome without diarrhea: K58.9

## 2023-02-22 HISTORY — PX: ANTERIOR AND POSTERIOR REPAIR: SHX5121

## 2023-02-22 LAB — ABO/RH: ABO/RH(D): O POS

## 2023-02-22 LAB — TYPE AND SCREEN
ABO/RH(D): O POS
Antibody Screen: NEGATIVE

## 2023-02-22 SURGERY — HYSTERECTOMY, VAGINAL
Anesthesia: General | Site: Vagina

## 2023-02-22 MED ORDER — ROCURONIUM BROMIDE 10 MG/ML (PF) SYRINGE
PREFILLED_SYRINGE | INTRAVENOUS | Status: DC | PRN
  Administered 2023-02-22: 20 mg via INTRAVENOUS
  Administered 2023-02-22: 50 mg via INTRAVENOUS

## 2023-02-22 MED ORDER — DEXAMETHASONE SODIUM PHOSPHATE 10 MG/ML IJ SOLN
INTRAMUSCULAR | Status: AC
  Filled 2023-02-22: qty 1

## 2023-02-22 MED ORDER — ONDANSETRON HCL 4 MG/2ML IJ SOLN
INTRAMUSCULAR | Status: DC | PRN
  Administered 2023-02-22: 4 mg via INTRAVENOUS

## 2023-02-22 MED ORDER — 0.9 % SODIUM CHLORIDE (POUR BTL) OPTIME
TOPICAL | Status: DC | PRN
Start: 2023-02-22 — End: 2023-02-22
  Administered 2023-02-22: 500 mL

## 2023-02-22 MED ORDER — LIDOCAINE HCL (PF) 2 % IJ SOLN
INTRAMUSCULAR | Status: AC
  Filled 2023-02-22: qty 5

## 2023-02-22 MED ORDER — ACETAMINOPHEN 500 MG PO TABS
ORAL_TABLET | ORAL | Status: AC
  Filled 2023-02-22: qty 2

## 2023-02-22 MED ORDER — POLYETHYLENE GLYCOL 3350 17 G PO PACK: 17.0000 g | PACK | Freq: Every day | ORAL | Status: DC | PRN

## 2023-02-22 MED ORDER — GABAPENTIN 300 MG PO CAPS
ORAL_CAPSULE | ORAL | Status: AC
  Filled 2023-02-22: qty 1

## 2023-02-22 MED ORDER — PROPOFOL 10 MG/ML IV BOLUS
INTRAVENOUS | Status: DC | PRN
  Administered 2023-02-22: 100 mg via INTRAVENOUS

## 2023-02-22 MED ORDER — IBUPROFEN 200 MG PO TABS
600.0000 mg | ORAL_TABLET | Freq: Four times a day (QID) | ORAL | Status: DC
  Administered 2023-02-22 – 2023-02-23 (×2): 600 mg via ORAL

## 2023-02-22 MED ORDER — SIMETHICONE 80 MG PO CHEW
80.0000 mg | CHEWABLE_TABLET | Freq: Four times a day (QID) | ORAL | Status: DC | PRN
  Administered 2023-02-23: 80 mg via ORAL

## 2023-02-22 MED ORDER — DOCUSATE SODIUM 100 MG PO CAPS
100.0000 mg | ORAL_CAPSULE | Freq: Two times a day (BID) | ORAL | Status: DC
  Administered 2023-02-22: 100 mg via ORAL

## 2023-02-22 MED ORDER — DROPERIDOL 2.5 MG/ML IJ SOLN: 0.6250 mg | Freq: Once | INTRAMUSCULAR | Status: DC | PRN

## 2023-02-22 MED ORDER — LIDOCAINE-EPINEPHRINE (PF) 1 %-1:200000 IJ SOLN
INTRAMUSCULAR | Status: DC | PRN
  Administered 2023-02-22 (×3): 10 mL

## 2023-02-22 MED ORDER — ACETAMINOPHEN 10 MG/ML IV SOLN: 1000.0000 mg | Freq: Once | INTRAVENOUS | Status: DC | PRN

## 2023-02-22 MED ORDER — ENSURE PRE-SURGERY PO LIQD: 592.0000 mL | Freq: Once | ORAL | Status: DC

## 2023-02-22 MED ORDER — CEFAZOLIN SODIUM-DEXTROSE 2-4 GM/100ML-% IV SOLN
2.0000 g | INTRAVENOUS | Status: AC
  Administered 2023-02-22: 2 g via INTRAVENOUS

## 2023-02-22 MED ORDER — PHENYLEPHRINE HCL (PRESSORS) 10 MG/ML IV SOLN
INTRAVENOUS | Status: AC
  Filled 2023-02-22: qty 1

## 2023-02-22 MED ORDER — ROCURONIUM BROMIDE 10 MG/ML (PF) SYRINGE
PREFILLED_SYRINGE | INTRAVENOUS | Status: AC
  Filled 2023-02-22: qty 10

## 2023-02-22 MED ORDER — OXYCODONE HCL 5 MG PO TABS: 5.0000 mg | ORAL_TABLET | Freq: Once | ORAL | Status: DC | PRN

## 2023-02-22 MED ORDER — MENTHOL 3 MG MT LOZG: 1.0000 | LOZENGE | OROMUCOSAL | Status: DC | PRN

## 2023-02-22 MED ORDER — ACETAMINOPHEN 325 MG PO TABS: 325.0000 mg | ORAL_TABLET | ORAL | Status: DC | PRN

## 2023-02-22 MED ORDER — DEXAMETHASONE SODIUM PHOSPHATE 10 MG/ML IJ SOLN
INTRAMUSCULAR | Status: DC | PRN
  Administered 2023-02-22: 10 mg via INTRAVENOUS

## 2023-02-22 MED ORDER — ONDANSETRON HCL 4 MG PO TABS: 4.0000 mg | ORAL_TABLET | Freq: Four times a day (QID) | ORAL | Status: DC | PRN

## 2023-02-22 MED ORDER — GABAPENTIN 300 MG PO CAPS
300.0000 mg | ORAL_CAPSULE | ORAL | Status: AC
  Administered 2023-02-22: 300 mg via ORAL

## 2023-02-22 MED ORDER — CELECOXIB 200 MG PO CAPS
400.0000 mg | ORAL_CAPSULE | ORAL | Status: AC
  Administered 2023-02-22: 400 mg via ORAL

## 2023-02-22 MED ORDER — FENTANYL CITRATE (PF) 100 MCG/2ML IJ SOLN: 25.0000 ug | INTRAMUSCULAR | Status: DC | PRN

## 2023-02-22 MED ORDER — IBUPROFEN 200 MG PO TABS
ORAL_TABLET | ORAL | Status: AC
  Filled 2023-02-22: qty 3

## 2023-02-22 MED ORDER — PHENYLEPHRINE 80 MCG/ML (10ML) SYRINGE FOR IV PUSH (FOR BLOOD PRESSURE SUPPORT)
PREFILLED_SYRINGE | INTRAVENOUS | Status: DC | PRN
  Administered 2023-02-22 (×2): 80 ug via INTRAVENOUS

## 2023-02-22 MED ORDER — FENTANYL CITRATE (PF) 250 MCG/5ML IJ SOLN
INTRAMUSCULAR | Status: AC
  Filled 2023-02-22: qty 5

## 2023-02-22 MED ORDER — DOCUSATE SODIUM 100 MG PO CAPS
ORAL_CAPSULE | ORAL | Status: AC
  Filled 2023-02-22: qty 1

## 2023-02-22 MED ORDER — POVIDONE-IODINE 10 % EX SWAB: 2.0000 | Freq: Once | CUTANEOUS | Status: DC

## 2023-02-22 MED ORDER — MIDAZOLAM HCL 2 MG/2ML IJ SOLN
INTRAMUSCULAR | Status: AC
  Filled 2023-02-22: qty 2

## 2023-02-22 MED ORDER — LIDOCAINE 2% (20 MG/ML) 5 ML SYRINGE
INTRAMUSCULAR | Status: DC | PRN
  Administered 2023-02-22: 100 mg via INTRAVENOUS

## 2023-02-22 MED ORDER — LACTATED RINGERS IV SOLN: INTRAVENOUS | Status: DC

## 2023-02-22 MED ORDER — PHENYLEPHRINE 80 MCG/ML (10ML) SYRINGE FOR IV PUSH (FOR BLOOD PRESSURE SUPPORT)
PREFILLED_SYRINGE | INTRAVENOUS | Status: AC
  Filled 2023-02-22: qty 10

## 2023-02-22 MED ORDER — ONDANSETRON HCL 4 MG/2ML IJ SOLN: 4.0000 mg | Freq: Four times a day (QID) | INTRAMUSCULAR | Status: DC | PRN

## 2023-02-22 MED ORDER — CEFAZOLIN SODIUM-DEXTROSE 2-4 GM/100ML-% IV SOLN
INTRAVENOUS | Status: AC
  Filled 2023-02-22: qty 100

## 2023-02-22 MED ORDER — ACETAMINOPHEN 500 MG PO TABS
1000.0000 mg | ORAL_TABLET | ORAL | Status: AC
  Administered 2023-02-22: 1000 mg via ORAL

## 2023-02-22 MED ORDER — PROPOFOL 10 MG/ML IV BOLUS
INTRAVENOUS | Status: AC
  Filled 2023-02-22: qty 20

## 2023-02-22 MED ORDER — OXYCODONE HCL 5 MG PO TABS
ORAL_TABLET | ORAL | Status: AC
  Filled 2023-02-22: qty 1

## 2023-02-22 MED ORDER — SUGAMMADEX SODIUM 200 MG/2ML IV SOLN
INTRAVENOUS | Status: DC | PRN
  Administered 2023-02-22: 200 mg via INTRAVENOUS

## 2023-02-22 MED ORDER — ENSURE PRE-SURGERY PO LIQD: 296.0000 mL | Freq: Once | ORAL | Status: DC

## 2023-02-22 MED ORDER — PHENYLEPHRINE HCL-NACL 20-0.9 MG/250ML-% IV SOLN
INTRAVENOUS | Status: DC | PRN
  Administered 2023-02-22: 20 ug/min via INTRAVENOUS

## 2023-02-22 MED ORDER — OXYCODONE HCL 5 MG PO TABS
5.0000 mg | ORAL_TABLET | ORAL | Status: DC | PRN
  Administered 2023-02-22 – 2023-02-23 (×4): 5 mg via ORAL

## 2023-02-22 MED ORDER — OXYCODONE HCL 5 MG/5ML PO SOLN: 5.0000 mg | Freq: Once | ORAL | Status: DC | PRN

## 2023-02-22 MED ORDER — ACETAMINOPHEN 160 MG/5ML PO SOLN: 325.0000 mg | ORAL | Status: DC | PRN

## 2023-02-22 MED ORDER — MIDAZOLAM HCL 2 MG/2ML IJ SOLN
INTRAMUSCULAR | Status: DC | PRN
  Administered 2023-02-22: 2 mg via INTRAVENOUS

## 2023-02-22 MED ORDER — ONDANSETRON HCL 4 MG/2ML IJ SOLN
INTRAMUSCULAR | Status: AC
  Filled 2023-02-22: qty 2

## 2023-02-22 MED ORDER — ESTRADIOL 0.1 MG/GM VA CREA
TOPICAL_CREAM | VAGINAL | Status: DC | PRN
  Administered 2023-02-22: 1 via VAGINAL

## 2023-02-22 MED ORDER — FENTANYL CITRATE (PF) 250 MCG/5ML IJ SOLN
INTRAMUSCULAR | Status: DC | PRN
  Administered 2023-02-22: 50 ug via INTRAVENOUS
  Administered 2023-02-22: 25 ug via INTRAVENOUS
  Administered 2023-02-22 (×2): 50 ug via INTRAVENOUS
  Administered 2023-02-22: 25 ug via INTRAVENOUS

## 2023-02-22 MED ORDER — STERILE WATER FOR IRRIGATION IR SOLN
Status: DC | PRN
Start: 2023-02-22 — End: 2023-02-22
  Administered 2023-02-22: 500 mL

## 2023-02-22 MED ORDER — CELECOXIB 200 MG PO CAPS
ORAL_CAPSULE | ORAL | Status: AC
  Filled 2023-02-22: qty 2

## 2023-02-22 MED ORDER — ACETAMINOPHEN 500 MG PO TABS
1000.0000 mg | ORAL_TABLET | Freq: Four times a day (QID) | ORAL | Status: DC
  Administered 2023-02-22 – 2023-02-23 (×3): 1000 mg via ORAL

## 2023-02-22 SURGICAL SUPPLY — 35 items
DRAPE SURG IRRIG POUCH 19X23 (DRAPES) ×1 IMPLANT
GAUZE 4X4 16PLY ~~LOC~~+RFID DBL (SPONGE) ×1 IMPLANT
GAUZE PACKING 1INX5YD STRL (GAUZE/BANDAGES/DRESSINGS) ×1 IMPLANT
GLOVE BIOGEL PI IND STRL 6.5 (GLOVE) IMPLANT
GLOVE BIOGEL PI IND STRL 7.0 (GLOVE) ×3 IMPLANT
GLOVE ECLIPSE 6.5 STRL STRAW (GLOVE) ×1 IMPLANT
GLOVE SURG SS PI 7.0 STRL IVOR (GLOVE) IMPLANT
GLOVE SURG SYN 6.5 ES PF (GLOVE) ×4 IMPLANT
GLOVE SURG SYN 6.5 PF PI (GLOVE) IMPLANT
GOWN STRL REUS W/ TWL LRG LVL3 (GOWN DISPOSABLE) IMPLANT
GOWN STRL REUS W/TWL LRG LVL3 (GOWN DISPOSABLE) ×5 IMPLANT
HOLDER FOLEY CATH W/STRAP (MISCELLANEOUS) IMPLANT
KIT TURNOVER CYSTO (KITS) ×1 IMPLANT
NDL HYPO 22X1.5 SAFETY MO (MISCELLANEOUS) ×1 IMPLANT
NDL SPNL 22GX3.5 QUINCKE BK (NEEDLE) ×1 IMPLANT
NEEDLE HYPO 22X1.5 SAFETY MO (MISCELLANEOUS) ×1 IMPLANT
NEEDLE SPNL 22GX3.5 QUINCKE BK (NEEDLE) ×1 IMPLANT
NS IRRIG 500ML POUR BTL (IV SOLUTION) IMPLANT
PACK VAGINAL WOMENS (CUSTOM PROCEDURE TRAY) ×1 IMPLANT
PAD OB MATERNITY 4.3X12.25 (PERSONAL CARE ITEMS) ×1 IMPLANT
SCRUB CHG 4% DYNA-HEX 4OZ (MISCELLANEOUS) ×1 IMPLANT
SLEEVE SCD COMPRESS KNEE MED (STOCKING) ×1 IMPLANT
SPIKE FLUID TRANSFER (MISCELLANEOUS) IMPLANT
SUT VIC AB 0 CT1 18XCR BRD8 (SUTURE) ×3 IMPLANT
SUT VIC AB 0 CT1 27 (SUTURE) ×3
SUT VIC AB 0 CT1 27XBRD ANBCTR (SUTURE) ×2 IMPLANT
SUT VIC AB 0 CT1 8-18 (SUTURE) ×2
SUT VIC AB 2-0 CT2 27 (SUTURE) ×4 IMPLANT
SUT VIC AB 2-0 SH 27 (SUTURE) ×5
SUT VIC AB 2-0 SH 27XBRD (SUTURE) ×4 IMPLANT
SUT VIC AB 3-0 CT1 36 (SUTURE) IMPLANT
SUT VICRYL 0 TIES 12 18 (SUTURE) ×1 IMPLANT
TOWEL OR 17X24 6PK STRL BLUE (TOWEL DISPOSABLE) ×1 IMPLANT
TRAY FOLEY W/BAG SLVR 14FR LF (SET/KITS/TRAYS/PACK) ×1 IMPLANT
WATER STERILE IRR 500ML POUR (IV SOLUTION) IMPLANT

## 2023-02-22 NOTE — Op Note (Signed)
Preoperative diagnosis:pelvic prolapse  Postoperative diagnosis: Same   Anesthesia: General  Anesthesiologist: Dr Tacy Dura  Procedure: Total vaginal hysterectomy with anterior and posterior repair and perineoplasty   Surgeon: Dr. Dois Davenport Joshaua Epple   Assistant: Henreitta Leber P.A.-C.   Estimated blood loss: 100 cc   Procedure:   After being informed of the planned procedure with possible complications including but not limited to infection, bleeding, injury to other organs, need for laparotomy, expected hospitals they and recovery time, informed consent was obtained and patient was taken to or #5.   She was given general anesthesia with endotracheal intubation without any complication. She was placed in the lithotomy position prepped and draped in a sterile fashion and a Foley catheter was inserted in her bladder.   A weighted speculum is inserted in the vagina and the uterus was grasped with 1 Christella Hartigan forcep. We infiltrate the mucosa around the cervix using 1% Lidocaine with Epinephrine 1:200,000. We perform a circular incision around the cervix and then proceed with blunt and sharp dissection of the anterior and posterior vaginal mucosa. This identifies the posterior cul-de-sac which was sharply opened. The bladder was retracted upward . Both uterosacral ligaments are isolated on a Rogers forcep, sectioned, sutured with a transfixed suture of 0 Vicryl maintained for future suspension. The anterior cul-de-sac is then dissected up and the peritoneum is opened sharply. This allows for easy isolation of the vascular pedicle on each side using a Rogers forcep. The vascular pedicle is then sectioned and sutured with a transfixed suture of 0 Vicryl. We can then isolate part of the broad ligament on each side using a Rogers forceps. These pedicles are sectioned and sutured with a transfixed suture of 0 Vicryl. We now have easy access to the final pedicle incorporating the round ligament, the utero-ovarian  ligament and tube. This last pedicle is then clamped with Rogers forcep, sectioned and doubly sutured with a transfixed suture of 0 Vicryl and a free tie of 0 Vicryl. The uterus is removed.   Both ovaries are visualized and normal. We have easy access to both tubes so we proceed with bilateral salpingectomy by isolating the mesosalpinx on a Rogers forceps bilaterally. Each tube is then sharply removed and the pedicle is sutured with a transfixed suture of 0 Vicryl and secured with a free tie of 0 Vicryl.   We then systematically verified hemostasis on all pedicles. Hemostasis is completed with figure-of-eight sutures of 0 Vicryl. Hemostasis is then confirmed adequate. We proceed with closure of the posterior cul-de-sac by placing a suture reuniting the 2 uterosacral ligaments as well as the posterior cul-de-sac with 0 Vicryl. We then placed 2 vaginal suspension sutures using 0 Vicryl which will you reunite the posterior vaginal mucosa, the posterior peritoneum, the corresponding uterosacral ligaments and the anterior peritoneum. The sutures are then tied which closes the peritoneum at the same time.   Proceeding with anterior repair: We isolate the anterior vaginal mucosa using Allis forceps and infiltrated using 1% Lidocaine with Epinephrine 1:200,000. The vaginal mucosa is then undermined medially and sharply dissected all the way to 1 cm below the urethrovesical junction. We proceed with sharp and blunt dissection of the prevesical fascia until the cystocele was completely freed. We then correct the cystocele with multilayered U- stitches of 2-0 Vicryl until complete resolution. Excess of vaginal mucosa is excised. The anterior vaginal mucosa is then closed using figure-of-eight stitches of 3-0 Vicryl.   The vaginal cuff is then closed transversely using figure-of-eight sutures of 3-0  Vicryl.   Proceeding with posterior repair: We grasped the posterior forceps on a distance of 2.5 cm infiltrate the  posterior vaginal mucosa using  1% Lidocaine with Epinephrine 1:200,000. We sharply dissect the posterior vaginal mucosa midline until 2 cm from the vaginal cuff. We then sharply and bluntly dissect this mucosa away from the perirectal fascia until the rectocele was completely mobilized. We then correct the rectocele will multilayered U-stitches of 2-0 Vicryl until complete resolution. The excess vaginal mucosa is excised and the posterior vaginal mucosa is closed with a running lock suture of 3-0 Vicryl. The perineal muscles are then reapproximated with simple sutures of 3-0 Vicryl. The perineal skin is then closed with subcuticular suture of 3-0 Vicryl.   Instruments and sponge count is complete x2.   The procedure is well tolerated by the patient who is taken to recovery room in a well and stable condition.  Dr Estanislado Pandy was present and scrubbed at all times. Surgical assistance was required due to the complexity of the anatomy and the vaginal approach of the procedure.    Estimated blood loss is 50 cc   Specimen: Uterus with 2 tubes sent to pathology

## 2023-02-22 NOTE — Discharge Instructions (Signed)
Call [facility] at [phone] for :  Post operative appointment time and date if it is not included in your post operative information.  Temperature greater than or equal to 100.4 degrees Farenheit orally Excessive pain not managed with your pain medications Excessive bleeding, problems urinating or other concerns   While taking pain medications, take Colace (Docusate Sodium) 100 mg 2-3 times daily until bowel movements are regular to prevent constipation.  Take, with food, Ibuprofen 600 mg and Acetaminophen #2-500 mg tablets every 6 hours for 5 days then as needed for pain    You may shower tomorrow You may walk up stairs slowly  You may drive after 2 weeks You may resume a regular diet  You should not lift over 15 pounds for 6 weeks You should not place anything in your vagina for 6 weeks, except you may gently apply estradiol vaginal cream in your vagina, 3 times a week, starting March 01, 2023  using  only your finger

## 2023-02-22 NOTE — Interval H&P Note (Signed)
History and Physical Interval Note:  02/22/2023 12:42 PM  Sherry Reed  has presented today for surgery, with the diagnosis of PELVIC PROLAPSE.  The various methods of treatment have been discussed with the patient and family. After consideration of risks, benefits and other options for treatment, the patient has consented to  Procedure(s): TOTAL HYSTERECTOMY VAGINAL WITH BILATERAL SALPINGECTOMY (N/A) ANTERIOR (CYSTOCELE) AND POSTERIOR REPAIR (RECTOCELE) (N/A) as a surgical intervention.  The patient's history has been reviewed, patient examined, no change in status, stable for surgery.  I have reviewed the patient's chart and labs.  Questions were answered to the patient's satisfaction.     Dois Davenport A Drinda Belgard

## 2023-02-22 NOTE — Anesthesia Procedure Notes (Signed)
Procedure Name: Intubation Date/Time: 02/22/2023 1:00 PM  Performed by: Dairl Ponder, CRNAPre-anesthesia Checklist: Patient identified, Emergency Drugs available, Suction available and Patient being monitored Patient Re-evaluated:Patient Re-evaluated prior to induction Oxygen Delivery Method: Circle System Utilized Preoxygenation: Pre-oxygenation with 100% oxygen Induction Type: IV induction Ventilation: Mask ventilation without difficulty Laryngoscope Size: Mac and 3 Grade View: Grade I Tube type: Oral Tube size: 7.0 mm Number of attempts: 1 Airway Equipment and Method: Stylet and Oral airway Placement Confirmation: ETT inserted through vocal cords under direct vision, positive ETCO2 and breath sounds checked- equal and bilateral Secured at: 22 cm Tube secured with: Tape Dental Injury: Teeth and Oropharynx as per pre-operative assessment

## 2023-02-22 NOTE — Transfer of Care (Signed)
Immediate Anesthesia Transfer of Care Note  Patient: Sherry Reed  Procedure(s) Performed: TOTAL HYSTERECTOMY VAGINAL WITH BILATERAL SALPINGECTOMY (Vagina ) ANTERIOR (CYSTOCELE) AND POSTERIOR REPAIR (RECTOCELE) (Vagina )  Patient Location: PACU  Anesthesia Type:General  Level of Consciousness: drowsy and patient cooperative  Airway & Oxygen Therapy: Patient Spontanous Breathing and Patient connected to nasal cannula oxygen  Post-op Assessment: Report given to RN and Post -op Vital signs reviewed and stable  Post vital signs: Reviewed and stable  Last Vitals:  Vitals Value Taken Time  BP 121/61 02/22/23 1553  Temp    Pulse 56 02/22/23 1554  Resp 12 02/22/23 1554  SpO2 100 % 02/22/23 1554  Vitals shown include unfiled device data.  Last Pain:  Vitals:   02/22/23 1058  TempSrc: Oral  PainSc: 0-No pain      Patients Stated Pain Goal: 5 (02/22/23 1058)  Complications: No notable events documented.

## 2023-02-22 NOTE — Anesthesia Preprocedure Evaluation (Addendum)
Anesthesia Evaluation  Patient identified by MRN, date of birth, ID band Patient awake    Reviewed: Allergy & Precautions, H&P , NPO status , Patient's Chart, lab work & pertinent test results  Airway Mallampati: I  TM Distance: >3 FB Neck ROM: Full    Dental no notable dental hx. (+) Teeth Intact, Dental Advisory Given   Pulmonary neg pulmonary ROS, sleep apnea , pneumonia   Pulmonary exam normal breath sounds clear to auscultation       Cardiovascular Exercise Tolerance: Good negative cardio ROS Normal cardiovascular exam Rhythm:Regular Rate:Normal     Neuro/Psych negative neurological ROS  negative psych ROS   GI/Hepatic negative GI ROS, Neg liver ROS,,,  Endo/Other  negative endocrine ROSHypothyroidism    Renal/GU negative Renal ROS  negative genitourinary   Musculoskeletal negative musculoskeletal ROS (+) Arthritis ,    Abdominal   Peds negative pediatric ROS (+)  Hematology negative hematology ROS (+)   Anesthesia Other Findings   Reproductive/Obstetrics negative OB ROS                             Anesthesia Physical Anesthesia Plan  ASA: 2  Anesthesia Plan: General   Post-op Pain Management: Minimal or no pain anticipated, Tylenol PO (pre-op)* and Celebrex PO (pre-op)*   Induction: Intravenous  PONV Risk Score and Plan: 3 and Ondansetron, Dexamethasone, Midazolam and Treatment may vary due to age or medical condition  Airway Management Planned: Oral ETT  Additional Equipment: None  Intra-op Plan:   Post-operative Plan: Extubation in OR  Informed Consent: I have reviewed the patients History and Physical, chart, labs and discussed the procedure including the risks, benefits and alternatives for the proposed anesthesia with the patient or authorized representative who has indicated his/her understanding and acceptance.       Plan Discussed with: Anesthesiologist  and CRNA  Anesthesia Plan Comments: (  )       Anesthesia Quick Evaluation

## 2023-02-23 ENCOUNTER — Encounter (HOSPITAL_BASED_OUTPATIENT_CLINIC_OR_DEPARTMENT_OTHER): Payer: Self-pay | Admitting: Obstetrics and Gynecology

## 2023-02-23 DIAGNOSIS — N8189 Other female genital prolapse: Secondary | ICD-10-CM | POA: Diagnosis not present

## 2023-02-23 LAB — SURGICAL PATHOLOGY

## 2023-02-23 MED ORDER — IBUPROFEN 200 MG PO TABS
ORAL_TABLET | ORAL | Status: AC
  Filled 2023-02-23: qty 3

## 2023-02-23 MED ORDER — OXYCODONE HCL 5 MG PO TABS
ORAL_TABLET | ORAL | Status: AC
  Filled 2023-02-23: qty 1

## 2023-02-23 MED ORDER — SIMETHICONE 80 MG PO CHEW
CHEWABLE_TABLET | ORAL | Status: AC
  Filled 2023-02-23: qty 1

## 2023-02-23 MED ORDER — ACETAMINOPHEN 500 MG PO TABS
ORAL_TABLET | ORAL | Status: AC
  Filled 2023-02-23: qty 2

## 2023-02-23 NOTE — Progress Notes (Signed)
Bladder was filled with 200 cc of NS via foley catheter. Foley catheter removed peri care done and pt up to BR voided 250 cc of red urine. New mesh panties and pad placed and pt back to bed.    Vaginal packing removed before catheter was removed for moderate amt of blooding packing .

## 2023-02-23 NOTE — Anesthesia Postprocedure Evaluation (Addendum)
Anesthesia Post Note  Patient: Sherry Reed  Procedure(s) Performed: TOTAL HYSTERECTOMY VAGINAL WITH BILATERAL SALPINGECTOMY (Vagina ) ANTERIOR (CYSTOCELE) AND POSTERIOR REPAIR (RECTOCELE) (Vagina )     Patient location during evaluation: PACU Anesthesia Type: General Level of consciousness: awake and alert Pain management: pain level controlled Vital Signs Assessment: post-procedure vital signs reviewed and stable Respiratory status: spontaneous breathing, nonlabored ventilation, respiratory function stable and patient connected to nasal cannula oxygen Cardiovascular status: blood pressure returned to baseline and stable Postop Assessment: no apparent nausea or vomiting Anesthetic complications: no  No notable events documented.              Shelton Silvas

## 2023-02-23 NOTE — Progress Notes (Signed)
Sherry Reed is a15 y.o.  [id number]  Post Op Date # 1: TVH/BS/A-P Colporrhaphy  Subjective: Patient is Doing well postoperatively. Patient has Pain is controlled with current analgesics. Medications being used: acetaminophen and ibuprofen (OTC). Ambulating without difficulty, voiding, tolerating regular diet.  Objective: Vital signs in last 24 hours: Temp:  [97 F (36.1 C)-99.3 F (37.4 C)] 99.3 F (37.4 C) (10/11 0437) Pulse Rate:  [56-93] 93 (10/11 0437) Resp:  [10-20] 16 (10/11 0437) BP: (118-147)/(55-99) 118/55 (10/11 0437) SpO2:  [96 %-100 %] 96 % (10/11 0437) Weight:  [74.9 kg] 74.9 kg (10/10 1058)  Intake/Output from previous day: 10/10 0701 - 10/11 0700 In: 970 [P.O.:120; I.V.:650] Out: 1000 [Urine:950] Intake/Output this shift: No intake/output data recorded. Recent Labs  Lab 02/20/23 1025  WBC 5.6  HGB 12.8  HCT 39.6  PLT 202    No results for input(s): "NA", "K", "CL", "CO2", "BUN", "CREATININE", "CALCIUM", "PROT", "BILITOT", "ALKPHOS", "ALT", "AST", "GLUCOSE" in the last 168 hours.  Invalid input(s): "LABALBU"  EXAM: General: alert, cooperative, and no distress Resp: clear to auscultation bilaterally Cardio: regular rate and rhythm, S1, S2 normal, no murmur, click, rub or gallop GI: bowel sounds present and soft Extremities: extremities normal, atraumatic, no cyanosis or edema and no calf tenderness. Vaginal Bleeding: moderate   Assessment: s/p Procedure(s): TOTAL HYSTERECTOMY VAGINAL WITH BILATERAL SALPINGECTOMY ANTERIOR (CYSTOCELE) AND POSTERIOR REPAIR (RECTOCELE): stable, progressing well, and tolerating diet  Plan: Discharge home  LOS: 0 days    Henreitta Leber, PA-C 02/23/2023 8:05 AM

## 2023-02-23 NOTE — Progress Notes (Signed)
[place] was told pt vaginal packing with mod amt of bloody drainage on packing.   Will dr. Estanislado Pandy know.   Will continue to monitor

## 2023-03-06 DIAGNOSIS — N898 Other specified noninflammatory disorders of vagina: Secondary | ICD-10-CM | POA: Diagnosis not present

## 2023-03-06 DIAGNOSIS — R030 Elevated blood-pressure reading, without diagnosis of hypertension: Secondary | ICD-10-CM | POA: Diagnosis not present

## 2023-03-12 DIAGNOSIS — R030 Elevated blood-pressure reading, without diagnosis of hypertension: Secondary | ICD-10-CM | POA: Diagnosis not present

## 2023-03-12 DIAGNOSIS — Z4889 Encounter for other specified surgical aftercare: Secondary | ICD-10-CM | POA: Diagnosis not present

## 2023-03-12 DIAGNOSIS — N898 Other specified noninflammatory disorders of vagina: Secondary | ICD-10-CM | POA: Diagnosis not present

## 2023-03-23 ENCOUNTER — Encounter (HOSPITAL_COMMUNITY): Payer: Self-pay

## 2023-03-23 NOTE — Therapy (Signed)
[facility] Outpatient Rehabilitation at [facility] [address] [place], [place], [address] Phone: [phone]   Fax:  [phone]  Patient Details  Name: Sherry Reed MRN: [id number] Date of Birth: [birth year] Referring Provider:  No ref. provider found  PHYSICAL THERAPY DISCHARGE SUMMARY  Visits from Start of Care: 7  Current functional level related to goals / functional outcomes: unknown   Remaining deficits: unknown   Education / Equipment: HEP   Patient agrees to discharge. Patient goals were not met. Patient is being discharged due to not returning since the last visit.  [facility] Outpatient Rehabilitation at [facility] [address] [place], [place], [address] Phone: [phone]   Fax:  [phone]

## 2023-04-16 DIAGNOSIS — Z79890 Hormone replacement therapy (postmenopausal): Secondary | ICD-10-CM | POA: Diagnosis not present

## 2023-04-16 DIAGNOSIS — H9193 Unspecified hearing loss, bilateral: Secondary | ICD-10-CM | POA: Diagnosis not present

## 2023-04-16 DIAGNOSIS — Z8249 Family history of ischemic heart disease and other diseases of the circulatory system: Secondary | ICD-10-CM | POA: Diagnosis not present

## 2023-04-16 DIAGNOSIS — N3941 Urge incontinence: Secondary | ICD-10-CM | POA: Diagnosis not present

## 2023-04-16 DIAGNOSIS — M1990 Unspecified osteoarthritis, unspecified site: Secondary | ICD-10-CM | POA: Diagnosis not present

## 2023-04-16 DIAGNOSIS — E039 Hypothyroidism, unspecified: Secondary | ICD-10-CM | POA: Diagnosis not present

## 2023-04-16 DIAGNOSIS — Z791 Long term (current) use of non-steroidal anti-inflammatories (NSAID): Secondary | ICD-10-CM | POA: Diagnosis not present

## 2023-04-16 DIAGNOSIS — M8580 Other specified disorders of bone density and structure, unspecified site: Secondary | ICD-10-CM | POA: Diagnosis not present

## 2023-04-16 DIAGNOSIS — N182 Chronic kidney disease, stage 2 (mild): Secondary | ICD-10-CM | POA: Diagnosis not present

## 2023-04-30 DIAGNOSIS — L821 Other seborrheic keratosis: Secondary | ICD-10-CM | POA: Diagnosis not present

## 2023-04-30 DIAGNOSIS — D1801 Hemangioma of skin and subcutaneous tissue: Secondary | ICD-10-CM | POA: Diagnosis not present

## 2023-04-30 DIAGNOSIS — Z09 Encounter for follow-up examination after completed treatment for conditions other than malignant neoplasm: Secondary | ICD-10-CM | POA: Diagnosis not present

## 2023-04-30 DIAGNOSIS — L814 Other melanin hyperpigmentation: Secondary | ICD-10-CM | POA: Diagnosis not present

## 2023-04-30 DIAGNOSIS — L568 Other specified acute skin changes due to ultraviolet radiation: Secondary | ICD-10-CM | POA: Diagnosis not present

## 2023-06-13 DIAGNOSIS — E039 Hypothyroidism, unspecified: Secondary | ICD-10-CM | POA: Diagnosis not present

## 2023-06-13 DIAGNOSIS — E782 Mixed hyperlipidemia: Secondary | ICD-10-CM | POA: Diagnosis not present

## 2023-06-18 DIAGNOSIS — N3946 Mixed incontinence: Secondary | ICD-10-CM | POA: Diagnosis not present

## 2023-06-18 DIAGNOSIS — B351 Tinea unguium: Secondary | ICD-10-CM | POA: Diagnosis not present

## 2023-06-18 DIAGNOSIS — E559 Vitamin D deficiency, unspecified: Secondary | ICD-10-CM | POA: Diagnosis not present

## 2023-06-18 DIAGNOSIS — M8580 Other specified disorders of bone density and structure, unspecified site: Secondary | ICD-10-CM | POA: Diagnosis not present

## 2023-06-18 DIAGNOSIS — E039 Hypothyroidism, unspecified: Secondary | ICD-10-CM | POA: Diagnosis not present

## 2023-06-18 DIAGNOSIS — G4719 Other hypersomnia: Secondary | ICD-10-CM | POA: Diagnosis not present

## 2023-06-18 DIAGNOSIS — R350 Frequency of micturition: Secondary | ICD-10-CM | POA: Diagnosis not present

## 2023-06-18 DIAGNOSIS — K580 Irritable bowel syndrome with diarrhea: Secondary | ICD-10-CM | POA: Diagnosis not present

## 2023-06-18 DIAGNOSIS — E782 Mixed hyperlipidemia: Secondary | ICD-10-CM | POA: Diagnosis not present

## 2023-06-19 ENCOUNTER — Other Ambulatory Visit: Payer: Self-pay | Admitting: Orthopaedic Surgery

## 2023-06-29 ENCOUNTER — Telehealth: Payer: Self-pay | Admitting: Internal Medicine

## 2023-06-29 NOTE — Telephone Encounter (Signed)
Inbound call from patient, would like to discuss further advise she states she is having serious GERD and loss of appetite, She states she would like to be seen sooner than scheduled or if she can speak to someone to advise on her symptoms.

## 2023-06-29 NOTE — Telephone Encounter (Signed)
Pt having issues with gas. She has tried otc meds and they only constipate her. She has tried only eating foods that do not produce a lot of gas but that is hard to maintain and be able to eat out and socialize. She states the gas has a bad odor to it and she needs some help. Pt scheduled to see Hyacinth Meeker PA 07/10/23 at 1:30pm. Pt aware of appt.

## 2023-07-10 ENCOUNTER — Ambulatory Visit: Payer: Medicare PPO | Admitting: Physician Assistant

## 2023-07-10 ENCOUNTER — Encounter: Payer: Self-pay | Admitting: Physician Assistant

## 2023-07-10 ENCOUNTER — Other Ambulatory Visit: Payer: Medicare PPO

## 2023-07-10 VITALS — BP 148/82 | HR 84 | Ht 65.0 in | Wt 167.2 lb

## 2023-07-10 DIAGNOSIS — R143 Flatulence: Secondary | ICD-10-CM | POA: Diagnosis not present

## 2023-07-10 DIAGNOSIS — R140 Abdominal distension (gaseous): Secondary | ICD-10-CM

## 2023-07-10 DIAGNOSIS — R194 Change in bowel habit: Secondary | ICD-10-CM | POA: Diagnosis not present

## 2023-07-10 NOTE — Progress Notes (Signed)
 Noted.

## 2023-07-10 NOTE — Progress Notes (Signed)
 Chief Complaint: Flatulence and bloating  HPI:    Sherry Reed is a 73 year old female with a past medical history as listed below, known to Dr. Marina Goodell, who presents to clinic today for continued gas complaints as well as occasional episodes of diarrhea and constipation.    05/24/2021 patient seen in clinic by Doug Sou for gas and fecal incontinence.  Apparently and had a good but short response to Xifaxan previously.  She was on a probiotic.  They have discussed FODMAP diet.  She is taking Benefiber.  Apparently only had a 2-week response to Xifaxan.  At that point on Simethicone which has not really helping.  Scheduled for a colonoscopy.    07/15/2021 colonoscopy with one 2 mm polyp in the ascending colon, diverticulosis in the sigmoid colon and otherwise normal.  At that point recommended Citrucel powder 2 tablespoons daily.  Also provided with literature on intestinal gas.  Biopsies showed tubular adenoma negative for microscopic colitis.    Today, patient presents to clinic and tells me that all of her symptoms have gotten worse over the past year or so.  She continues with a lot of flatulence all throughout the day as well as borborygmi and tells me that this is hindering her lifestyle.  This gas is very smelly.  Tells me is "not socially acceptable", she really has to think about whether or not she is going to go out somewhere and if she can eat anything on the menu.  If she does abide by a very strict low gas producing diet it seems to help but this is hard to do.  It is interfering with her life.  Will occasionally be constipated, does seem like her gas is a little bit better when she is able to have a good bowel movement in the morning but that does not happen every day.  She typically drinks strong coffee or hot water and this helps.  Has tried Dulcolax daily in the past which seems to make gas worse.  Also has tried fiber supplements which seem to make gas worse.  Occasional episodes of  diarrhea depending on what she is eating.  Does note that she is lactose intolerant.  Has previously been tested for celiac disease by her PCP and was negative.  Her husband wonders if she has Giardia as she has traveled out of the country a lot.  Apparently he had this in the past.    Denies fever, chills, weight loss or blood in her stool.  Past Medical History:  Diagnosis Date   Arthritis    knees and hands   Chronic knee pain    right, follows w/ ortho   Female cystocele    grade 2   Hearing loss    bilateral   Hypothyroidism    On thyroid replacement   Observed sleep apnea    Status post UVPP   Pneumonia    >10 years ago as of 02/12/2023 per pt   Rectocele    grade 1   SOB (shortness of breath)    Stress incontinence    urine   Thyroid disease    Wears glasses    Wears hearing aid    bilateral    Past Surgical History:  Procedure Laterality Date   ANTERIOR AND POSTERIOR REPAIR N/A 02/22/2023   Procedure: ANTERIOR (CYSTOCELE) AND POSTERIOR REPAIR (RECTOCELE);  Surgeon: Silverio Lay, MD;  Location: [facility];  Service: Gynecology;  Laterality: N/A;   COLONOSCOPY  07/15/2021  ascending colon tubular adenoma   THROAT SURGERY     Uvulopalatopharyngoplasty   TONSILLECTOMY     VAGINAL HYSTERECTOMY N/A 02/22/2023   Procedure: TOTAL HYSTERECTOMY VAGINAL WITH BILATERAL SALPINGECTOMY;  Surgeon: Silverio Lay, MD;  Location: [facility];  Service: Gynecology;  Laterality: N/A;    Current Outpatient Medications  Medication Sig Dispense Refill   levothyroxine (SYNTHROID) 100 MCG tablet Take 1 tablet by mouth daily.     meloxicam (MOBIC) 7.5 MG tablet TAKE 1 TABLET(7.5 MG) BY MOUTH DAILY 90 tablet 1   Multiple Vitamin (MULTIVITAMIN WITH MINERALS) TABS tablet Take 1 tablet by mouth daily.     Calcium-Phosphorus-Vitamin D (CALCIUM/VITAMIN D3/ADULT GUMMY PO) Take 2 tablets by mouth daily. (Patient not taking: Reported on 02/12/2023)      Cholecalciferol 1.25 MG (50000 UT) capsule cholecalciferol (vitamin D3) 1,250 mcg (50,000 unit) capsule  TAKE 1 CAPSULE(S) EVERY WEEK BY ORAL ROUTE FOR 56 DAYS. (Patient not taking: Reported on 02/12/2023)     docusate sodium (COLACE) 100 MG capsule Take 100 mg by mouth 2 (two) times daily. (Patient not taking: Reported on 02/12/2023)     fluticasone (FLONASE) 50 MCG/ACT nasal spray Place 1 spray into both nostrils as needed. (Patient not taking: Reported on 02/12/2023)     No current facility-administered medications for this visit.    Allergies as of 07/10/2023   (No Known Allergies)    Family History  Problem Relation Age of Onset   Pneumonia Mother        died of this   Diabetes Father    Heart disease Father     Social History   Socioeconomic History   Marital status: Married    Spouse name: Not on file   Number of children: Not on file   Years of education: Not on file   Highest education level: Not on file  Occupational History   Not on file  Tobacco Use   Smoking status: Never   Smokeless tobacco: Never  Vaping Use   Vaping status: Never Used  Substance and Sexual Activity   Alcohol use: Not Currently   Drug use: No   Sexual activity: Not on file  Other Topics Concern   Not on file  Social History Narrative   Not on file   Social Drivers of Health   Financial Resource Strain: Not on file  Food Insecurity: Not on file  Transportation Needs: Not on file  Physical Activity: Not on file  Stress: Not on file  Social Connections: Not on file  Intimate Partner Violence: Not on file    Review of Systems:    Constitutional: No weight loss, fever or chills Cardiovascular: No chest pain   Respiratory: No SOB  Gastrointestinal: See HPI and otherwise negative   Physical Exam:  Vital signs: BP (!) 148/82   Pulse 84   Ht 5\' 5"  (1.651 m)   Wt 167 lb 4 oz (75.9 kg)   BMI 27.83 kg/m    Constitutional:   Pleasant elderly Caucasian female appears to be in NAD,  Well developed, Well nourished, alert and cooperative Respiratory: Respirations even and unlabored. Lungs clear to auscultation bilaterally.   No wheezes, crackles, or rhonchi.  Cardiovascular: Normal S1, S2. No MRG. Regular rate and rhythm. No peripheral edema, cyanosis or pallor.  Gastrointestinal:  Soft, nondistended, nontender. No rebound or guarding. Increased BS all four quadrants. No appreciable masses or hepatomegaly. Rectal:  Not performed.  Psychiatric: Demonstrates good judgement and reason without abnormal affect  or behaviors.  RELEVANT LABS AND IMAGING: CBC    Component Value Date/Time   WBC 5.6 02/20/2023 1025   RBC 4.13 02/20/2023 1025   HGB 12.8 02/20/2023 1025   HCT 39.6 02/20/2023 1025   PLT 202 02/20/2023 1025   MCV 95.9 02/20/2023 1025   MCH 31.0 02/20/2023 1025   MCHC 32.3 02/20/2023 1025   RDW 13.0 02/20/2023 1025   Assessment: 1.  Flatulence/borborygmi: Worse over the past couple of years, prior colonoscopy with no abnormality, some relief with Xifaxan in the past but she has remained on a very strong probiotic since then; consider SIBO versus diet versus allergy versus pancreatic insufficiency versus constipation 2.  Change in bowel habits: Can radiate back-and-forth from constipation to diarrhea, typically has a bowel movement every 2 to 3 days, when she is able to have a bowel movement helps with gas production  Plan: 1.  Concerned that her constipation may be playing a role in her foul-smelling flatulence.  Recommend that she start MiraLAX daily. 2.  Recommend she discontinue Florastor for the next month to see how she is doing 3.  Ordered a breath test for SIBO today 4.  Ordered fecal pancreatic elastase and O&P testing for possible Giardia 5.  Patient to follow in clinic with me in 2 to 3 months or sooner if necessary.  Sherry Meeker, PA-C [facility] 07/10/2023, 1:38 PM  Cc: Benita Stabile, MD

## 2023-07-10 NOTE — Patient Instructions (Addendum)
 Stop Florastor.  Start Miralax once daily.  Your provider has requested that you go to the basement level for lab work before leaving today. Press "B" on the elevator. The lab is located at the first door on the left as you exit the elevator.   You have been given a testing kit to check for small intestine bacterial overgrowth (SIBO) which is completed by a company named [facility]. Make sure to return your test in the mail using the return mailing label given to you along with the kit. The test order, your demographic and insurance information have all already been sent to the company. [facility] will collect an upfront charge of $99.74 for commercial insurance plans and $209.74 if you are paying cash. Make sure to discuss with [facility] PRIOR to having the test to see if they have gotten information from your insurance company as to how much your testing will cost out of pocket, if any. Please contact [facility] at phone number [phone] to get instructions regarding how to perform the test as our office is unable to give specific testing instructions.   Due to recent changes in healthcare laws, you may see the results of your imaging and laboratory studies on MyChart before your provider has had a chance to review them.  We understand that in some cases there may be results that are confusing or concerning to you. Not all laboratory results come back in the same time frame and the provider may be waiting for multiple results in order to interpret others.  Please give [place] 48 hours in order for your provider to thoroughly review all the results before contacting the office for clarification of your results.   You have been scheduled for a follow up appointment on Friday, 09/21/23 at 2:30 PM. Please arrive 10 minutes early for registration. If you need to reschedule or cancel this appointment please call [phone] as soon as possible. Thank you.

## 2023-07-11 ENCOUNTER — Other Ambulatory Visit: Payer: Medicare PPO

## 2023-07-11 DIAGNOSIS — R140 Abdominal distension (gaseous): Secondary | ICD-10-CM

## 2023-07-11 DIAGNOSIS — R194 Change in bowel habit: Secondary | ICD-10-CM | POA: Diagnosis not present

## 2023-07-11 DIAGNOSIS — R143 Flatulence: Secondary | ICD-10-CM | POA: Diagnosis not present

## 2023-07-13 DIAGNOSIS — R140 Abdominal distension (gaseous): Secondary | ICD-10-CM | POA: Diagnosis not present

## 2023-07-13 DIAGNOSIS — R141 Gas pain: Secondary | ICD-10-CM | POA: Diagnosis not present

## 2023-07-16 ENCOUNTER — Encounter: Payer: Self-pay | Admitting: Podiatry

## 2023-07-16 ENCOUNTER — Ambulatory Visit: Payer: Medicare PPO | Admitting: Podiatry

## 2023-07-16 ENCOUNTER — Ambulatory Visit (INDEPENDENT_AMBULATORY_CARE_PROVIDER_SITE_OTHER)

## 2023-07-16 DIAGNOSIS — M7750 Other enthesopathy of unspecified foot: Secondary | ICD-10-CM

## 2023-07-16 DIAGNOSIS — K5730 Diverticulosis of large intestine without perforation or abscess without bleeding: Secondary | ICD-10-CM | POA: Insufficient documentation

## 2023-07-16 DIAGNOSIS — H9193 Unspecified hearing loss, bilateral: Secondary | ICD-10-CM | POA: Insufficient documentation

## 2023-07-16 DIAGNOSIS — M7752 Other enthesopathy of left foot: Secondary | ICD-10-CM | POA: Diagnosis not present

## 2023-07-16 DIAGNOSIS — M899 Disorder of bone, unspecified: Secondary | ICD-10-CM | POA: Insufficient documentation

## 2023-07-16 DIAGNOSIS — R635 Abnormal weight gain: Secondary | ICD-10-CM | POA: Insufficient documentation

## 2023-07-16 LAB — OVA AND PARASITE EXAMINATION
CONCENTRATE RESULT:: NONE SEEN
MICRO NUMBER:: 16131960
SPECIMEN QUALITY:: ADEQUATE
TRICHROME RESULT:: NONE SEEN

## 2023-07-16 NOTE — Progress Notes (Signed)
 Chief Complaint  Patient presents with   Foot Pain    5th met base left - feels like a sock is bunched up in the area, swelling occasionally, ongoing x few months   Nail Problem    Concerned about toenails and her hiking - notices after her hikes she get damaged toenails and not sure why, changed into [employer] shoes, but didn't notice a difference, toes get sore around the nail also   New Patient (Initial Visit)    HPI: 73 y.o. female presenting today as a new patient for evaluation of bilateral foot issues.  Patient states that she is an avid hiker and when she walks down he will her second toe seem to jam at the end of her shoes.  She has tried different shoes but downhill walking seems to aggravate her toes and toenails no matter what shoes she wears. Also the patient states that she developed some pain and tenderness along the lateral column of the left foot.  She does admit to walking around the house and socks only.  No history of injury.  It is only intermittent and occasional.  Past Medical History:  Diagnosis Date   Arthritis    knees and hands   Chronic knee pain    right, follows w/ ortho   Female cystocele    grade 2   Hearing loss    bilateral   Hypothyroidism    On thyroid replacement   Observed sleep apnea    Status post UVPP   Pneumonia    >10 years ago as of 02/12/2023 per pt   Rectocele    grade 1   SOB (shortness of breath)    Stress incontinence    urine   Thyroid disease    Wears glasses    Wears hearing aid    bilateral    Past Surgical History:  Procedure Laterality Date   ANTERIOR AND POSTERIOR REPAIR N/A 02/22/2023   Procedure: ANTERIOR (CYSTOCELE) AND POSTERIOR REPAIR (RECTOCELE);  Surgeon: Silverio Lay, MD;  Location: [facility];  Service: Gynecology;  Laterality: N/A;   COLONOSCOPY  07/15/2021   ascending colon tubular adenoma   THROAT SURGERY     Uvulopalatopharyngoplasty   TONSILLECTOMY     VAGINAL HYSTERECTOMY N/A  02/22/2023   Procedure: TOTAL HYSTERECTOMY VAGINAL WITH BILATERAL SALPINGECTOMY;  Surgeon: Silverio Lay, MD;  Location: [facility];  Service: Gynecology;  Laterality: N/A;    No Known Allergies   Physical Exam: General: The patient is alert and oriented x3 in no acute distress.  Dermatology: Skin is warm, dry and supple bilateral lower extremities.  There is some nail dystrophy noted to the second digits bilateral consistent with persistent microtrauma  Vascular: Palpable pedal pulses bilaterally. Capillary refill within normal limits.  No appreciable edema.  No erythema.  Neurological: Grossly intact via light touch  Musculoskeletal Exam: No pedal deformities noted.  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  No acute fractures identified.  Assessment/Plan of Care: 1.  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  Mild intermittent lateral column foot pain left foot  -Patient evaluated.  X-rays reviewed -In regards to the toenail second digits bilateral I did advise her that the issue is simply coming from downhill walking when she  hikes.  She agrees.  Recommend the best shoes that can potentially alleviate this or toe cushions -Advised the patient against going barefoot.  Recommend good supportive shoes or slides even around the house to support the foot.  The majority the patient's pain to the lateral column of the left foot is secondary to walking and socks only on hardwood and linoleum floors -Return to clinic as needed       Felecia Shelling, DPM [facility]  Dr. Felecia Shelling, DPM    [address] [place], [place] [address]                Office [phone]   Fax [phone]

## 2023-07-16 NOTE — Patient Instructions (Signed)
Fleet feet running store 

## 2023-07-18 ENCOUNTER — Telehealth: Payer: Self-pay | Admitting: Physician Assistant

## 2023-07-18 LAB — PANCREATIC ELASTASE, FECAL: Pancreatic Elastase-1, Stool: >800 ug/g (ref >200)

## 2023-07-18 NOTE — Telephone Encounter (Signed)
Patient calling in regards to results. Please advise.   Thank you

## 2023-07-18 NOTE — Telephone Encounter (Signed)
 Called and informed patient that O/P testing was negative for Giardia. Pancreatic elastase result pending. Pt states that Miralax has been "terrible" causing her a lot of gas. Patient would not like to hold Florastor for a month as recommended. Pt states that the best she has felt is when she was taking Florastor. Pt will resume since she is going out of town soon and she is nervous about not being on it. Pt is aware that we will contact her once remainder of stool tests return. Pt verbalized understanding.

## 2023-07-20 ENCOUNTER — Other Ambulatory Visit: Payer: Self-pay | Admitting: Physician Assistant

## 2023-07-20 ENCOUNTER — Telehealth: Payer: Self-pay

## 2023-07-20 MED ORDER — RIFAXIMIN 550 MG PO TABS
550.0000 mg | ORAL_TABLET | Freq: Three times a day (TID) | ORAL | 0 refills | Status: AC
Start: 2023-07-20 — End: 2023-08-03

## 2023-07-20 NOTE — Telephone Encounter (Signed)
 Called and spoke with patient regarding SIBO results and recommendations. Pt will be going out of town to [place] tomorrow and returning on Thursday, 07/26/23. Patient will complete SIBO treatment when she returns. Patient is aware that has been sent to [facility]. Patient will discontinue Miralax and resume Florastor. Patient has been advised to keep May follow up with Victorino Dike as scheduled for reassessment. Pt had no concerns at the end of the call.

## 2023-07-20 NOTE — Telephone Encounter (Signed)
-----   Message from Unk [employer] sent at 07/20/2023 11:05 AM EST ----- Regarding: SIBO Please let patient know that her SIBO breath testing came back positive.  Please send in Xifaxan 550 mg 3 times daily x 2 weeks 42 with no refill.  Also please arrange for her to be seen back in clinic in the next 2 to 3 months so we can discuss results of medication.  Thanks, JL L

## 2023-07-20 NOTE — Progress Notes (Signed)
 Result encounter  07/20/2023  11:04 AM  Received results from patient's SIBO breath testing with bacterial overgrowth suspected.  Will have my nurse send in Xifaxan 550 mg 3 times daily x 2 weeks #42 with no refill.  Will also arrange for patient to be seen back in clinic in the next 2 to 3 months to discuss how this works for her.  Hyacinth Meeker, PA-C

## 2023-07-24 ENCOUNTER — Other Ambulatory Visit: Payer: Self-pay | Admitting: Obstetrics and Gynecology

## 2023-07-24 DIAGNOSIS — Z1231 Encounter for screening mammogram for malignant neoplasm of breast: Secondary | ICD-10-CM

## 2023-07-27 ENCOUNTER — Telehealth: Payer: Self-pay | Admitting: Physician Assistant

## 2023-07-27 ENCOUNTER — Ambulatory Visit: Payer: Medicare PPO | Admitting: Physician Assistant

## 2023-07-27 NOTE — Telephone Encounter (Signed)
 Patient is calling back regarding her SIBO test results. Patient is requesting a call back.Please advise.

## 2023-07-27 NOTE — Telephone Encounter (Signed)
 Call addressed in previous task.

## 2023-07-31 DIAGNOSIS — H6121 Impacted cerumen, right ear: Secondary | ICD-10-CM | POA: Diagnosis not present

## 2023-09-05 DIAGNOSIS — H903 Sensorineural hearing loss, bilateral: Secondary | ICD-10-CM | POA: Diagnosis not present

## 2023-09-18 ENCOUNTER — Other Ambulatory Visit: Payer: Self-pay | Admitting: Obstetrics and Gynecology

## 2023-09-18 DIAGNOSIS — M8580 Other specified disorders of bone density and structure, unspecified site: Secondary | ICD-10-CM | POA: Diagnosis not present

## 2023-09-18 DIAGNOSIS — Z6826 Body mass index (BMI) 26.0-26.9, adult: Secondary | ICD-10-CM | POA: Diagnosis not present

## 2023-09-18 DIAGNOSIS — N941 Unspecified dyspareunia: Secondary | ICD-10-CM | POA: Diagnosis not present

## 2023-09-18 DIAGNOSIS — Z1211 Encounter for screening for malignant neoplasm of colon: Secondary | ICD-10-CM | POA: Diagnosis not present

## 2023-09-18 DIAGNOSIS — Z1231 Encounter for screening mammogram for malignant neoplasm of breast: Secondary | ICD-10-CM | POA: Diagnosis not present

## 2023-09-18 DIAGNOSIS — Z01411 Encounter for gynecological examination (general) (routine) with abnormal findings: Secondary | ICD-10-CM | POA: Diagnosis not present

## 2023-09-19 DIAGNOSIS — H1132 Conjunctival hemorrhage, left eye: Secondary | ICD-10-CM | POA: Diagnosis not present

## 2023-09-21 ENCOUNTER — Ambulatory Visit: Payer: Medicare PPO | Admitting: Physician Assistant

## 2023-09-24 ENCOUNTER — Encounter (HOSPITAL_COMMUNITY): Payer: Self-pay

## 2023-10-01 ENCOUNTER — Ambulatory Visit
Admission: RE | Admit: 2023-10-01 | Discharge: 2023-10-01 | Disposition: A | Discharge status: Home / Self Care | Source: Ambulatory Visit | Attending: Obstetrics and Gynecology | Admitting: Obstetrics and Gynecology

## 2023-10-01 DIAGNOSIS — Z1231 Encounter for screening mammogram for malignant neoplasm of breast: Secondary | ICD-10-CM | POA: Diagnosis not present

## 2023-10-23 DIAGNOSIS — N9411 Superficial (introital) dyspareunia: Secondary | ICD-10-CM | POA: Diagnosis not present

## 2023-11-05 ENCOUNTER — Other Ambulatory Visit

## 2023-11-06 DIAGNOSIS — N9411 Superficial (introital) dyspareunia: Secondary | ICD-10-CM | POA: Diagnosis not present

## 2023-11-26 DIAGNOSIS — N9411 Superficial (introital) dyspareunia: Secondary | ICD-10-CM | POA: Diagnosis not present

## 2023-11-26 DIAGNOSIS — N958 Other specified menopausal and perimenopausal disorders: Secondary | ICD-10-CM | POA: Diagnosis not present

## 2023-12-17 DIAGNOSIS — M6289 Other specified disorders of muscle: Secondary | ICD-10-CM | POA: Diagnosis not present

## 2023-12-17 DIAGNOSIS — N958 Other specified menopausal and perimenopausal disorders: Secondary | ICD-10-CM | POA: Diagnosis not present

## 2023-12-17 DIAGNOSIS — N9411 Superficial (introital) dyspareunia: Secondary | ICD-10-CM | POA: Diagnosis not present

## 2024-01-03 DIAGNOSIS — E782 Mixed hyperlipidemia: Secondary | ICD-10-CM | POA: Diagnosis not present

## 2024-01-03 DIAGNOSIS — E039 Hypothyroidism, unspecified: Secondary | ICD-10-CM | POA: Diagnosis not present

## 2024-01-09 DIAGNOSIS — M79671 Pain in right foot: Secondary | ICD-10-CM | POA: Diagnosis not present

## 2024-01-09 DIAGNOSIS — M8580 Other specified disorders of bone density and structure, unspecified site: Secondary | ICD-10-CM | POA: Diagnosis not present

## 2024-01-09 DIAGNOSIS — R350 Frequency of micturition: Secondary | ICD-10-CM | POA: Diagnosis not present

## 2024-01-09 DIAGNOSIS — E782 Mixed hyperlipidemia: Secondary | ICD-10-CM | POA: Diagnosis not present

## 2024-01-09 DIAGNOSIS — M79672 Pain in left foot: Secondary | ICD-10-CM | POA: Diagnosis not present

## 2024-01-09 DIAGNOSIS — E559 Vitamin D deficiency, unspecified: Secondary | ICD-10-CM | POA: Diagnosis not present

## 2024-01-09 DIAGNOSIS — N3946 Mixed incontinence: Secondary | ICD-10-CM | POA: Diagnosis not present

## 2024-01-09 DIAGNOSIS — E039 Hypothyroidism, unspecified: Secondary | ICD-10-CM | POA: Diagnosis not present

## 2024-01-09 DIAGNOSIS — K580 Irritable bowel syndrome with diarrhea: Secondary | ICD-10-CM | POA: Diagnosis not present

## 2024-01-31 DIAGNOSIS — H6123 Impacted cerumen, bilateral: Secondary | ICD-10-CM | POA: Diagnosis not present

## 2024-01-31 DIAGNOSIS — Z974 Presence of external hearing-aid: Secondary | ICD-10-CM | POA: Diagnosis not present

## 2024-02-04 ENCOUNTER — Other Ambulatory Visit: Payer: Self-pay | Admitting: Orthopaedic Surgery

## 2024-02-14 ENCOUNTER — Ambulatory Visit: Attending: Obstetrics and Gynecology | Admitting: Physical Therapy

## 2024-02-14 ENCOUNTER — Encounter: Payer: Self-pay | Admitting: Physical Therapy

## 2024-02-14 ENCOUNTER — Other Ambulatory Visit: Payer: Self-pay

## 2024-02-14 DIAGNOSIS — R293 Abnormal posture: Secondary | ICD-10-CM | POA: Insufficient documentation

## 2024-02-14 DIAGNOSIS — R279 Unspecified lack of coordination: Secondary | ICD-10-CM | POA: Diagnosis not present

## 2024-02-14 DIAGNOSIS — M6281 Muscle weakness (generalized): Secondary | ICD-10-CM | POA: Diagnosis not present

## 2024-02-14 NOTE — Therapy (Signed)
 OUTPATIENT PHYSICAL THERAPY FEMALE PELVIC EVALUATION   Patient Name: Sherry Reed MRN: [id number] DOB:21-Feb-1951, 73 y.o., female Today's Date: 02/14/2024  END OF SESSION:  PT End of Session - 02/14/24 1053     Visit Number 1    Number of Visits 8    Date for Recertification  04/10/24    Authorization Type [employer] MCR    PT Start Time 1015    PT Stop Time 1100    PT Time Calculation (min) 45 min    Activity Tolerance Patient tolerated treatment well    Behavior During Therapy WFL for tasks assessed/performed          Past Medical History:  Diagnosis Date   Arthritis    knees and hands   Chronic knee pain    right, follows w/ ortho   Female cystocele    grade 2   Hearing loss    bilateral   Hypothyroidism    On thyroid  replacement   Observed sleep apnea    Status post UVPP   Pneumonia    >10 years ago as of 02/12/2023 per pt   Rectocele    grade 1   SOB (shortness of breath)    Stress incontinence    urine   Thyroid  disease    Wears glasses    Wears hearing aid    bilateral   Past Surgical History:  Procedure Laterality Date   ANTERIOR AND POSTERIOR REPAIR N/A 02/22/2023   Procedure: ANTERIOR (CYSTOCELE) AND POSTERIOR REPAIR (RECTOCELE);  Surgeon: Darcel Pool, MD;  Location: [facility];  Service: Gynecology;  Laterality: N/A;   COLONOSCOPY  07/15/2021   ascending colon tubular adenoma   THROAT SURGERY     Uvulopalatopharyngoplasty   TONSILLECTOMY     VAGINAL HYSTERECTOMY N/A 02/22/2023   Procedure: TOTAL HYSTERECTOMY VAGINAL WITH BILATERAL SALPINGECTOMY;  Surgeon: Darcel Pool, MD;  Location: [facility] ;  Service: Gynecology;  Laterality: N/A;   Patient Active Problem List   Diagnosis Date Noted   Abnormal weight gain 07/16/2023   Bilateral hearing loss 07/16/2023   Disorder of bone and articular cartilage 07/16/2023   Diverticulosis of colon 07/16/2023   Pelvic relaxation due to vaginal prolapse 02/22/2023    Otalgia of both ears 06/28/2022   Bloating 05/24/2021   Flatulence 05/24/2021   Abnormal renal function 05/05/2021   Increased frequency of urination 11/23/2020   Hypothyroidism 09/28/2020   Mixed hyperlipidemia 09/28/2020   Vitamin D deficiency 09/28/2020   Gas bloat syndrome 08/25/2020   Fecal smearing 08/25/2020   Change in bowel habits 08/25/2020   Sensorineural hearing loss (SNHL) of both ears 07/23/2018   Irritable bowel syndrome with diarrhea 05/30/2018   Low back pain 02/17/2013   Right knee pain 02/17/2013   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   IUD complication 06/24/2011   Graves' disease 06/24/2011   Osteopenia 06/24/2011   PCP: Shona Norleen PEDLAR, MD  REFERRING PROVIDER: Darcel Pool, MD   REFERRING DIAG: M62.89 (ICD-10-CM) - Other specified disorders of muscle  THERAPY DIAG:  Muscle weakness (generalized)  Unspecified lack of coordination  Abnormal posture  Rationale for Evaluation and Treatment: Rehabilitation  ONSET DATE: fecal incontinence started 3 months ago, bladder issues for years   SUBJECTIVE:  SUBJECTIVE STATEMENT: Patient reports to PFPT with pelvic floor dysfunction that she learned about from her OBGYN. She is hoping to get stronger in the pelvic floor to prevent urinary leakage and bowel leakage.  Fluid intake: water  or unsweet tea throughout the day   PERTINENT HISTORY:  Medications for current condition: no longer taking estradiol   Surgeries: hysterectomy last Christmas  Other: N/A Sexual abuse: No  PAIN:  Are you having pain? No NPRS scale: 0/10  PRECAUTIONS: None  RED FLAGS: None   WEIGHT BEARING RESTRICTIONS: No  FALLS:  Has patient fallen in last 6 months? No  OCCUPATION: retired but raises chestnuts   ACTIVITY LEVEL :  constantly on her feet, no exercise regimen   PLOF: Independent with basic ADLs  PATIENT GOALS: improved urinary control, strengthen the pelvic floor, prevent fecal incontinence   BOWEL MOVEMENT: Pain with bowel movement: No Type of bowel movement:Type (Bristol Stool Scale) 2-3 , Frequency within normal limits , Strain no, and Splinting no Fully empty rectum: No Leakage: Yes: looser in nature, small amount                                                       Caused by: unknown  Pads: Yes: 1x/day Fiber supplement/laxative Yes - probiotic   URINATION: Pain with urination: No Fully empty bladder: Yes:                                  Post-void dribble: No Stream: Strong Urgency: Yes  Frequency:during the day within normal limits                                                          Nocturia: Yes: 1-2x/night   Leakage: Urge to void Pads/briefs: Yes: 1x/night  INTERCOURSE: currently sexually active   Ability to have vaginal penetration Yes  Pain with intercourse: Initial Penetration, During Penetration, and Deep Penetration Dryness: Yes  *tightness present with intercourse, coconut oil is not helping   PREGNANCY: Vaginal deliveries 2 Tearing No Episiotomy No C-section deliveries 0 Currently pregnant No  PROLAPSE: None  OBJECTIVE:  Note: Objective measures were completed at Evaluation unless otherwise noted. PATIENT SURVEYS:  PFIQ-7: 45  COGNITION: Overall cognitive status: Within functional limits for tasks assessed     SENSATION: Light touch: Appears intact  LUMBAR SPECIAL TESTS:  Single leg stance test: Positive  FUNCTIONAL TESTS:  Single leg stance:  Rt: +  Lt: + Sit-up test: 1/3  Squat: stiffness present with pain in the left hip  Bed mobility: within normal limits   GAIT: Assistive device utilized: None Comments: moderate trendelenburg gait pattern with ambulation   POSTURE: rounded shoulders and forward head  LUMBARAROM/PROM: within normal  limits for all motions bilaterally   LOWER EXTREMITY ROM: within functional limits for all motions with no pain bilaterally   LOWER EXTREMITY MMT: 3+/5 bilateral gluteal and hip musculature with pain in left hip  PALPATION:  General: no tenderness to palpation of bilateral adductors or hip flexors   Pelvic Alignment: within normal limits   Abdominal: abdominal bracing at rest  Diastasis: No Distortion: No  Breathing: apical breathing pattern with decreased lower rib excursion with inhalation  Scar tissue: Yes:                  External Perineal Exam: dryness present with sufficient clitoral hood mobility                              Internal Pelvic Floor: Patient fully consents to today's internal examination. She has normal tone throughout bilateral aspects of superficial and deep pelvic floor musculature, no palpable trigger points. She has a shallow vaginal canal and had no pain during today's exam. She has a weakened pelvic floor contraction that is limited in range of motion due to stiffness. Lack of coordination present between diaphragm and pelvic floor. No pain following today's exam.   Patient confirms identification and approves PT to assess internal pelvic floor and treatment Yes No emotional/communication barriers or cognitive limitation. Patient is motivated to learn. Patient understands and agrees with treatment goals and plan. PT explains patient will be examined in standing, sitting, and lying down to see how their muscles and joints work. When they are ready, they will be asked to remove their underwear so PT can examine their perineum. The patient is also given the option of providing their own chaperone as one is not provided in our facility. The patient also has the right and is explained the right to defer or refuse any part of the evaluation or treatment including the internal exam. With the patient's consent, PT will use one gloved finger to gently assess the muscles of  the pelvic floor, seeing how well it contracts and relaxes and if there is muscle symmetry. After, the patient will get dressed and PT and patient will discuss exam findings and plan of care. PT and patient discuss plan of care, schedule, attendance policy and HEP activities.  PELVIC MMT:   MMT eval  Vaginal 2/5, 10 quick flicks, 3 second hold   Internal Anal Sphincter   External Anal Sphincter   Puborectalis   Diastasis Recti   (Blank rows = not tested)  TONE: Within normal limits bilaterally in superficial and deep pelvic floor musculature   PROLAPSE: None present in hooklying   TODAY'S TREATMENT:                                                                                                                              DATE:   EVAL 02/14/24: Examination completed, findings reviewed, pt educated on POC, HEP, and self care. Pt motivated to participate in PT and agreeable to attempt recommendations.   Hooklying diaphragmatic breathing + pelvic floor lengthening with inhalation + shortening with exhalation 2x10  Hooklying quick flick pelvic floor contractions + diaphragmatic breathing 2x10   PATIENT EDUCATION:  Education details: relative anatomy and the connection between the diaphragm and pelvic floor Person educated: Patient Education method: Explanation,  Demonstration, Tactile cues, Verbal cues, and Handouts Education comprehension: verbalized understanding, returned demonstration, verbal cues required, tactile cues required, and needs further education  HOME EXERCISE PROGRAM: Access Code: [email or web address] URL: [email or web address] Date: 02/14/2024 Prepared by: Celena Domino  Exercises - Supine Pelvic Floor Contraction  - 1 x daily - 7 x weekly - 3 sets - 10 reps - Quick Flick Pelvic Floor Contractions in Hooklying  - 1 x daily - 7 x weekly - 3 sets - 10 reps  Patient Education - Get To Know Your Pelvic Floor- Female - Urinary Urge Control Techniques - Bowel Emptying  Techniques  ASSESSMENT:  CLINICAL IMPRESSION: Patient is a 73 y.o. female  who was seen today for physical therapy evaluation and treatment for urinary and fecal incontinence. This urinary leakage began many years ago and her fecal incontinence began 3 months ago, small in amount. Patient has a history of hysterectomy 2024, left hip pain, and previous estradiol  use. Patient fully consents to today's internal examination. She has normal tone throughout bilateral aspects of superficial and deep pelvic floor musculature, no palpable trigger points. She has a shallow vaginal canal and had no pain during today's exam. She has a weakened pelvic floor contraction that is limited in range of motion due to stiffness. Lack of coordination present between diaphragm and pelvic floor. No pain following today's exam. Overall, pt tolerated session well and Pt would benefit from additional PT to further address deficits.    OBJECTIVE IMPAIRMENTS: decreased coordination, decreased endurance, decreased mobility, difficulty walking, decreased ROM, decreased strength, and pain.   ACTIVITY LIMITATIONS: continence and toileting  PARTICIPATION LIMITATIONS: community activity  PERSONAL FACTORS: Age, Past/current experiences, and Time since onset of injury/illness/exacerbation are also affecting patient's functional outcome.   REHAB POTENTIAL: Good  CLINICAL DECISION MAKING: Stable/uncomplicated  EVALUATION COMPLEXITY: Low   GOALS: Goals reviewed with patient? Yes  SHORT TERM GOALS: Target date: 03/13/2024  Pt will be independent with HEP.  Baseline: Goal status: INITIAL  2.  Pt will be independent with the knack, urge suppression technique, and double voiding in order to improve bladder habits and decrease urinary incontinence.  Baseline:  Goal status: INITIAL  3.  Pt will be independent with use of squatty potty, relaxed toileting mechanics, and improved bowel movement techniques in order to increase  ease of bowel movements and complete evacuation.  Baseline:  Goal status: INITIAL  LONG TERM GOALS: Target date: 08/14/2024  Pt will be independent with advanced HEP.  Baseline:  Goal status: INITIAL  2.  Pt to demonstrate improved coordination of pelvic floor and breathing mechanics with 10# squat with appropriate synergistic patterns to decrease pain and leakage at least 75% of the time for improved ability to complete a 30 minute workout with strain at pelvic floor and symptoms.  Baseline:  Goal status: INITIAL  3.  Pt will report her BMs are complete due to improved bowel habits and evacuation techniques.  Baseline:  Goal status: INITIAL  4.  Pt will have greater than 5 bowel movements a week in order to feel more comfortable and decrease pressure on urinogenital system.   Baseline:  Goal status: INITIAL  5.  Pt will report 0/10 pain with vaginal penetration in order to improve intimate relationship with partner.    Baseline:  Goal status: INITIAL  PLAN:  PT FREQUENCY: 1-2x/week  PT DURATION: 6 months  PLANNED INTERVENTIONS: 97110-Therapeutic exercises, 97530- Therapeutic activity, [id number]- Neuromuscular re-education, 97535- Self Care, [id number]- Manual therapy, Patient/Family education, Taping, Joint mobilization, Spinal  mobilization, Scar mobilization, Cryotherapy, Moist heat, and Biofeedback  PLAN FOR NEXT SESSION: continued pelvic floor training in seated, introduce toileting mechanics for optimal emptying, urge drill and knack drill, hip/glute/core strengthening  Celena JAYSON Domino, PT 02/14/2024, 11:03 AM

## 2024-02-18 ENCOUNTER — Ambulatory Visit: Admitting: Physician Assistant

## 2024-02-18 ENCOUNTER — Encounter: Payer: Self-pay | Admitting: Physician Assistant

## 2024-02-18 DIAGNOSIS — M76891 Other specified enthesopathies of right lower limb, excluding foot: Secondary | ICD-10-CM | POA: Diagnosis not present

## 2024-02-18 MED ORDER — METHYLPREDNISOLONE ACETATE 40 MG/ML IJ SUSP
40.0000 mg | INTRAMUSCULAR | Status: AC | PRN
Start: 2024-02-18 — End: 2024-02-18
  Administered 2024-02-18: 40 mg via INTRAMUSCULAR

## 2024-02-18 MED ORDER — LIDOCAINE HCL 1 % IJ SOLN
1.0000 mL | INTRAMUSCULAR | Status: AC | PRN
Start: 2024-02-18 — End: 2024-02-18
  Administered 2024-02-18: 1 mL

## 2024-02-18 NOTE — Progress Notes (Signed)
   Procedure Note  Patient: Sherry Reed             Date of Birth: Feb 04, 1951           MRN: [id number]             Visit Date: 02/18/2024  HPI: Sherry Reed comes in today for right hip buttocks pain that started a week ago.  She reports that she was riding a riding lawnmower quickly around [place] trees and developed pain in her right buttocks region.  Causes her to limp.  She denies any numbness tingling.  Denies any groin pain.  No bowel bladder dysfunction no fevers chills.  She denies any saddle anesthesia like symptoms.  She is unsure if the pain awakens her at night.  Tylenol  and Advil .  Physical exam: General Well-developed well-nourished female who ambulates without any assistive device.  Walks with a antalgic gait initially wearing after beginning to ambulate after rising from a seated position. Bilateral hips: Good range of motion of both hips without pain.  Slight tenderness over the trochanteric region bilaterally.  Maximal tenderness right parasacral region just lateral to the sacroiliac joint.  Negative straight leg raise bilaterally.  She is able to touch her toes and has good extension in the lumbar spine without pain.   Procedures: Visit Diagnoses:  1. Enthesopathy of right hip region     Trigger Point Inj  Date/Time: 02/18/2024 1:56 PM  Performed by: Gretta Bertrum ORN, PA-C Authorized by: Gretta Bertrum ORN, PA-C   Total # of Trigger Points:  1 Location: hip   Approach:  Dorsal Medications #1:  1 mL lidocaine  1 %; 40 mg methylPREDNISolone  acetate 40 MG/ML  Plan: Activities as tolerated.  Follow-up with us  in 2 weeks see how she is doing overall.  Questions were encouraged and answered at length.

## 2024-02-19 ENCOUNTER — Telehealth: Payer: Self-pay | Admitting: Physician Assistant

## 2024-02-19 NOTE — Telephone Encounter (Signed)
 Pt called asking for medical advice. Pt had an appt yesterday and pt states she can barley walk and want to know if there is anything else she can do for pain. Please call pt at [phone].

## 2024-02-20 ENCOUNTER — Other Ambulatory Visit (INDEPENDENT_AMBULATORY_CARE_PROVIDER_SITE_OTHER): Payer: Self-pay

## 2024-02-20 ENCOUNTER — Ambulatory Visit: Admitting: Physician Assistant

## 2024-02-20 DIAGNOSIS — M545 Low back pain, unspecified: Secondary | ICD-10-CM

## 2024-02-20 DIAGNOSIS — M25551 Pain in right hip: Secondary | ICD-10-CM

## 2024-02-20 MED ORDER — METHYLPREDNISOLONE 4 MG PO TABS: ORAL_TABLET | ORAL | 0 refills | Status: DC

## 2024-02-20 NOTE — Progress Notes (Signed)
 HPI: [name] returns today status post trigger point injection right sacral region 02/18/2024.  She states she got some relief from this.  But now pain is more mid sacrum area but she also describes that she is unable to lie on her right hip due to pain lateral aspect of the hip at times.  No real radicular symptoms down the leg.  Denies any numbness tingling.  No groin pain.  Difficulty ambulating due to the pain in the hip.  Review of systems see HPI otherwise negative  Physical exam: General Well-developed well-nourished female in no acute distress mood and affect appropriate.  Psych alert and oriented x 3.  Ambulates without any assistive device with slight antalgic gait. Bilateral hips: Excellent range of motion of both hips without pain.  Flexion both hips against resistance 5 out of 5 strength.  Tenderness over the right greater than left trochanteric region.  Also tenderness down the right IT band to approximately mid femur.  Lower extremities: Negative straight leg raise bilaterally.  No real tenderness over the right lumbar sacral region.  Slight tenderness in the mid lumbar paraspinous region only.  Radiographs: 2 views of the lumbar spine: No acute fractures.  Lower lumbar facet arthritic changes.  Disc space overall well-maintained.  Slight scoliosis.  No spondylolisthesis.  Arthrosclerosis of aorta.  AP pelvis lateral view of the right hip: Bilateral hips well located.  Hip joints are well-maintained bilaterally.  Sclerotic changes over the trochanteric regions left greater than right.  No acute fractures acute findings.  Impression: Right hip trochanteric bursitis/IT band syndrome Differential lumbar radiculopathy  Plan: Discussed with her the radiographic findings.  Do feel that she has trochanteric bursitis and IT band syndrome on the right.  Described to her that she may be having some pain from the facet arthritic changes she has of the lower lumbar spine.  Will place her on a  Medrol  Dosepak as she is not diabetic.  She is to stop all NSAIDs while on the Dosepak.  Will send her to [facility] as she is going there for pelvic floor exercises and we will have them work on back exercises, core strengthening, IT band stretching home exercise program and modalities.  IT band stretching exercises were shown.  Will see her back in 2 weeks unless her condition becomes worse.  Questions were encouraged and answered at length.

## 2024-02-20 NOTE — Addendum Note (Signed)
 Addended by: PETER FRIEZE B on: 02/20/2024 02:19 PM   Modules accepted: Orders

## 2024-02-25 NOTE — Therapy (Signed)
 OUTPATIENT PHYSICAL THERAPY THORACOLUMBAR EVALUATION   Patient Name: Sherry Reed MRN: [id number] DOB:1950/12/30, 73 y.o., female Today's Date: 02/26/2024  END OF SESSION:  PT End of Session - 02/26/24 0935     Visit Number 2   1 ortho   Date for Recertification  04/22/24    Authorization Type [facility]- requested for ortho    PT Start Time 0856    PT Stop Time 0931    PT Time Calculation (min) 35 min    Activity Tolerance Patient tolerated treatment well    Behavior During Therapy [facility] for tasks assessed/performed          Past Medical History:  Diagnosis Date   Arthritis    knees and hands   Chronic knee pain    right, follows w/ ortho   Female cystocele    grade 2   Hearing loss    bilateral   Hypothyroidism    On thyroid  replacement   Observed sleep apnea    Status post UVPP   Pneumonia    >10 years ago as of 02/12/2023 per pt   Rectocele    grade 1   SOB (shortness of breath)    Stress incontinence    urine   Thyroid  disease    Wears glasses    Wears hearing aid    bilateral   Past Surgical History:  Procedure Laterality Date   ANTERIOR AND POSTERIOR REPAIR N/A 02/22/2023   Procedure: ANTERIOR (CYSTOCELE) AND POSTERIOR REPAIR (RECTOCELE);  Surgeon: Darcel Pool, MD;  Location: [facility];  Service: Gynecology;  Laterality: N/A;   COLONOSCOPY  07/15/2021   ascending colon tubular adenoma   THROAT SURGERY     Uvulopalatopharyngoplasty   TONSILLECTOMY     VAGINAL HYSTERECTOMY N/A 02/22/2023   Procedure: TOTAL HYSTERECTOMY VAGINAL WITH BILATERAL SALPINGECTOMY;  Surgeon: Darcel Pool, MD;  Location: [facility];  Service: Gynecology;  Laterality: N/A;   Patient Active Problem List   Diagnosis Date Noted   Abnormal weight gain 07/16/2023   Bilateral hearing loss 07/16/2023   Disorder of bone and articular cartilage 07/16/2023   Diverticulosis of colon 07/16/2023   Pelvic relaxation due to vaginal prolapse  02/22/2023   Otalgia of both ears 06/28/2022   Bloating 05/24/2021   Flatulence 05/24/2021   Abnormal renal function 05/05/2021   Increased frequency of urination 11/23/2020   Hypothyroidism 09/28/2020   Mixed hyperlipidemia 09/28/2020   Vitamin D deficiency 09/28/2020   Gas bloat syndrome 08/25/2020   Fecal smearing 08/25/2020   Change in bowel habits 08/25/2020   Sensorineural hearing loss (SNHL) of both ears 07/23/2018   Irritable bowel syndrome with diarrhea 05/30/2018   Low back pain 02/17/2013   Right knee pain 02/17/2013   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   IUD complication 06/24/2011   Graves' disease 06/24/2011   Osteopenia 06/24/2011    PCP: Shona Rush, MD  REFERRING PROVIDER: Gretta Forte, PA  REFERRING DIAG:  Diagnosis  M25.551 (ICD-10-CM) - Pain in right hip  M54.50 (ICD-10-CM) - Lumbar pain    Rationale for Evaluation and Treatment: Rehabilitation  THERAPY DIAG:  Muscle weakness (generalized) - Plan: PT plan of care cert/re-cert  Abnormal posture - Plan: PT plan of care cert/re-cert  Other low back pain - Plan: PT plan of care cert/re-cert  Pain in right leg - Plan: PT plan of care cert/re-cert  Cramp and spasm - Plan: PT plan of care cert/re-cert  ONSET DATE: September 2025  SUBJECTIVE:  SUBJECTIVE STATEMENT: Pt presents to PT with LBP and Rt LE pain that began ~3 weeks ago.  Pt had injection into the Rt sacrum 02/18/24 without significant relief.  Pt cares for chestnuts and she reports that pain started after a day of working with them.  Pain is 8/10 and reports that this pain has significantly limited her ability to perform her daily activities.   From MD note: Sherry Reed returns today status post trigger point injection right sacral region  02/18/2024. She states she got some relief from this. But now pain is more mid sacrum area but she also describes that she is unable to lie on her right hip due to pain lateral aspect of the hip at times. No real radicular symptoms down the leg. Denies any numbness tingling. No groin pain. Difficulty ambulating due to the pain in the hip.   PERTINENT HISTORY:  Knee pain, stress incontinence  PAIN: 02/26/24 Are you having pain? Yes: NPRS scale: 0-8/10 Pain location: Rt sacrum, gluteals and lateral leg Pain description: hurting, intense, constant Aggravating factors: movement, standing and walking  Relieving factors: shift to Lt, not moving   PRECAUTIONS: None  RED FLAGS: None   WEIGHT BEARING RESTRICTIONS: No  FALLS:  Has patient fallen in last 6 months? No  LIVING ENVIRONMENT: Lives with: lives with their spouse Lives in: House/apartment Stairs: No Has following equipment at home: None  OCCUPATION: retired   PLOF: Independent, Vocation/Vocational requirements: retired, and Leisure: care fore chestnuts   PATIENT GOALS: reduce pain, care for [employer]  NEXT MD VISIT:   OBJECTIVE:  Note: Objective measures were completed at Evaluation unless otherwise noted.  DIAGNOSTIC FINDINGS:  02/20/24: X-ray:  AP pelvis lateral view of the right hip: Bilateral hips well located.  Hip  joints are well-maintained bilaterally.  Sclerotic changes over the  trochanteric regions left greater than right.  No acute fractures acute  findings.   PATIENT SURVEYS:  02/26/24: LEFS: 20/80=25%  COGNITION: Overall cognitive status: Within functional limits for tasks assessed     SENSATION: WFL  MUSCLE LENGTH: Hamstring length reduced by 25%   POSTURE: rounded shoulders, forward head, and weight shift left  PALPATION: Tension in Rt gluteals and pain at Rt SI joint  LUMBAR ROM:  WFLs without pain  LOWER EXTREMITY ROM:     Bil hip flexibility limited by 25% limitation bil.  Lateral Rt  hip pain with end range hip adduction and flexion   LOWER EXTREMITY MMT:    4+/5 bil hips 5/5 knees, 5/5 ankles   LUMBAR SPECIAL TESTS:  Straight leg raise test: Negative  FUNCTIONAL TESTS:  Max UE support with sit to stand due to Rt LE pain  GAIT: Distance walked: 50 Assistive device utilized: None Level of assistance: Complete Independence Comments: significant antalgia with reduced time on Rt LE   TREATMENT DATE:  02/26/24:  Findings from evaluation discussed, pt educated on plan of care, HEP initiated.  PATIENT EDUCATION:  Education details: Access Code: [email or web address] Person educated: Patient Education method: Explanation, Demonstration, and Handouts Education comprehension: verbalized understanding and returned demonstration  HOME EXERCISE PROGRAM: Access Code: [email or web address] URL: [email or web address] Date: 02/26/2024 Prepared by: Burnard  Exercises - Seated Figure 4 Piriformis Stretch  - 3 x daily - 7 x weekly - 3 sets - 10 reps - Seated Hamstring Stretch  - 3 x daily - 7 x weekly - 3 sets - 10 reps - Supine Hip Internal and External Rotation  - 2 x daily - 7 x weekly - 1 sets - 10 reps - 5 hold - Side to Side Weight Shift with Counter Support  - 3 x daily - 7 x weekly - 1-2 sets - 10 reps  ASSESSMENT:  CLINICAL IMPRESSION: Patient is a 73 y.o. female who was seen today for physical therapy evaluation and treatment for LBP and Rt hip pain. Pain began ~3 weeks ago with unknown cause.  Gait is antalgic upon entry into the clinic and pt sits and stand with Lt weight shift.  LEFS is 20/80 and pt requires max UE support for sit to stand transition.  Pt with palpable tenderness over Rt SI joint and tension in Rt gluteals.  Patient will benefit from skilled PT to address the below impairments and improve overall function.    OBJECTIVE  IMPAIRMENTS: Abnormal gait, decreased activity tolerance, difficulty walking, decreased ROM, increased fascial restrictions, impaired perceived functional ability, increased muscle spasms, improper body mechanics, postural dysfunction, and pain.   ACTIVITY LIMITATIONS: carrying, lifting, standing, squatting, transfers, hygiene/grooming, and locomotion level  PARTICIPATION LIMITATIONS: meal prep, cleaning, shopping, and community activity  PERSONAL FACTORS: Age and 1-2 comorbidities: incontinence, bursitis are also affecting patient's functional outcome.   REHAB POTENTIAL: Good  CLINICAL DECISION MAKING: Stable/uncomplicated  EVALUATION COMPLEXITY: Low   GOALS: Goals reviewed with patient? Yes  SHORT TERM GOALS: Target date: 03/25/2024    Be independent in initial HEP Baseline: Goal status: INITIAL  2.  Demonstrate symmetry with ambulation on level surface due to reduced pain Baseline:  Goal status: INITIAL  3.  Perform sit to stand with Rt=Lt weightbearing and no UE support due to reduced pain Baseline:  Goal status: INITIAL  4.  Report < or = to 6/10 Rt LE pain with standing and walking  Baseline:  Goal status: INITIAL 5. Improve LEFS to > or = to 30/80   Baseline: 20/80  Goal status: INITIAL   LONG TERM GOALS: Target date: 04/22/2024    Be independent in advanced HEP Baseline:  Goal status: INITIAL  2.  Improve LEFS to > or = to 50/80  Baseline: 20/80 Goal status: INITIAL  3.  Report > or = to 70% reduction in Rt LE pain with standing and walking  Baseline:  Goal status: INITIAL  4.  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  Stand and walk without limitation at home and in the community Baseline:  Goal status: INITIAL  PLAN:  PT FREQUENCY: 2x/week  PT DURATION: 8 weeks  PLANNED INTERVENTIONS: 97110-Therapeutic exercises, 97530- Therapeutic activity, 97112- Neuromuscular re-education,  97535- Self Care, 02859- Manual therapy, [phone]- Canalith repositioning, [id number]- Aquatic Therapy, [phone]- Electrical stimulation (unattended), [phone]- Electrical stimulation (manual), [id number]- Ionotophoresis 4mg /ml Dexamethasone , 79439 (1-2 muscles), 20561 (3+ muscles)- Dry Needling, Patient/Family education, Balance training, Stair training, Taping, Joint mobilization, Joint manipulation, Spinal manipulation, Spinal mobilization, Vestibular training, Cryotherapy, and Moist heat.  PLAN FOR NEXT  SESSION: dry needling to Rt gluteals and low back (discussed cost but didn't provide info), review HEP, work on gait and weightbearing into Rt LE   [employer], PT 02/26/24 11:51 AM

## 2024-02-26 ENCOUNTER — Other Ambulatory Visit: Payer: Self-pay

## 2024-02-26 ENCOUNTER — Ambulatory Visit: Attending: Physician Assistant

## 2024-02-26 DIAGNOSIS — M5459 Other low back pain: Secondary | ICD-10-CM | POA: Insufficient documentation

## 2024-02-26 DIAGNOSIS — R262 Difficulty in walking, not elsewhere classified: Secondary | ICD-10-CM | POA: Insufficient documentation

## 2024-02-26 DIAGNOSIS — M25551 Pain in right hip: Secondary | ICD-10-CM | POA: Insufficient documentation

## 2024-02-26 DIAGNOSIS — M545 Low back pain, unspecified: Secondary | ICD-10-CM | POA: Insufficient documentation

## 2024-02-26 DIAGNOSIS — M79604 Pain in right leg: Secondary | ICD-10-CM | POA: Insufficient documentation

## 2024-02-26 DIAGNOSIS — R252 Cramp and spasm: Secondary | ICD-10-CM | POA: Insufficient documentation

## 2024-02-26 DIAGNOSIS — M6281 Muscle weakness (generalized): Secondary | ICD-10-CM | POA: Insufficient documentation

## 2024-02-26 DIAGNOSIS — R293 Abnormal posture: Secondary | ICD-10-CM | POA: Insufficient documentation

## 2024-03-03 ENCOUNTER — Ambulatory Visit: Admitting: Physician Assistant

## 2024-03-03 ENCOUNTER — Encounter: Payer: Self-pay | Admitting: Physician Assistant

## 2024-03-03 DIAGNOSIS — M25551 Pain in right hip: Secondary | ICD-10-CM

## 2024-03-03 DIAGNOSIS — M545 Low back pain, unspecified: Secondary | ICD-10-CM

## 2024-03-03 NOTE — Progress Notes (Signed)
 HPI: Mrs. Westra returns today follow-up low back hip pain.  She is having no radicular symptoms.  She did get therapy and is unsure if this helped or if time is helpful or if going back on normal walks can help.  However she does not feel the prednisone helped with the hip and low back pain.  She still pointing to over the area of the SI joint on the left.  She states pain moves around.  She has some low back pain at times.  But overall she feels like she is doing better.  She still has therapy visits scheduled.  She is only been to 2 visits with PT as of now.  Review of systems: See HPI otherwise negative  Physical exam: General Well-developed well-nourished female no acute distress mood affect appropriate. Lower extremities: Negative straight leg raise bilaterally.  Tenderness over the right hip trochanteric region good range of motion both hips without pain.  Tenderness over the right SI joint region.  Impression: Right hip trochanteric bursitis Differential SI joint pain versus lumbar pain  Plan: Recommend she continue current regiment which is therapy Tylenol  and meloxicam .  She can take meloxicam  up to twice daily if she is taking 7.5 mg once daily at this point in time.  Discussed this with her at length.  Do feel since her symptoms are improving we will continue with current regiment.  If she fails to completely get rid of the pain or pain becomes worse she will return.  Otherwise follow-up as needed.

## 2024-03-05 ENCOUNTER — Ambulatory Visit

## 2024-03-05 DIAGNOSIS — M5459 Other low back pain: Secondary | ICD-10-CM | POA: Diagnosis not present

## 2024-03-05 DIAGNOSIS — M6281 Muscle weakness (generalized): Secondary | ICD-10-CM | POA: Diagnosis not present

## 2024-03-05 DIAGNOSIS — R293 Abnormal posture: Secondary | ICD-10-CM

## 2024-03-05 DIAGNOSIS — M79604 Pain in right leg: Secondary | ICD-10-CM | POA: Diagnosis not present

## 2024-03-05 DIAGNOSIS — M545 Low back pain, unspecified: Secondary | ICD-10-CM | POA: Diagnosis not present

## 2024-03-05 DIAGNOSIS — R252 Cramp and spasm: Secondary | ICD-10-CM

## 2024-03-05 DIAGNOSIS — R262 Difficulty in walking, not elsewhere classified: Secondary | ICD-10-CM | POA: Diagnosis not present

## 2024-03-05 DIAGNOSIS — M25551 Pain in right hip: Secondary | ICD-10-CM | POA: Diagnosis not present

## 2024-03-05 NOTE — Therapy (Signed)
 OUTPATIENT PHYSICAL THERAPY TREATMENT    Patient Name: Sherry Reed MRN: [id number] DOB:1950/12/04, 73 y.o., female Today's Date: 03/05/2024  END OF SESSION:  PT End of Session - 03/05/24 1154     Visit Number 3    Date for Recertification  04/22/24    Authorization Type [employer] [id number] vl 02/26/24-05/26/2024    PT Start Time 1105    PT Stop Time 1145    PT Time Calculation (min) 40 min    Activity Tolerance Patient tolerated treatment well    Behavior During Therapy WFL for tasks assessed/performed           Past Medical History:  Diagnosis Date   Arthritis    knees and hands   Chronic knee pain    right, follows w/ ortho   Female cystocele    grade 2   Hearing loss    bilateral   Hypothyroidism    On thyroid  replacement   Observed sleep apnea    Status post UVPP   Pneumonia    >10 years ago as of 02/12/2023 per pt   Rectocele    grade 1   SOB (shortness of breath)    Stress incontinence    urine   Thyroid  disease    Wears glasses    Wears hearing aid    bilateral   Past Surgical History:  Procedure Laterality Date   ANTERIOR AND POSTERIOR REPAIR N/A 02/22/2023   Procedure: ANTERIOR (CYSTOCELE) AND POSTERIOR REPAIR (RECTOCELE);  Surgeon: Darcel Pool, MD;  Location: [facility];  Service: Gynecology;  Laterality: N/A;   COLONOSCOPY  07/15/2021   ascending colon tubular adenoma   THROAT SURGERY     Uvulopalatopharyngoplasty   TONSILLECTOMY     VAGINAL HYSTERECTOMY N/A 02/22/2023   Procedure: TOTAL HYSTERECTOMY VAGINAL WITH BILATERAL SALPINGECTOMY;  Surgeon: Darcel Pool, MD;  Location: [facility];  Service: Gynecology;  Laterality: N/A;   Patient Active Problem List   Diagnosis Date Noted   Abnormal weight gain 07/16/2023   Bilateral hearing loss 07/16/2023   Disorder of bone and articular cartilage 07/16/2023   Diverticulosis of colon 07/16/2023   Pelvic relaxation due to vaginal prolapse 02/22/2023   Otalgia of  both ears 06/28/2022   Bloating 05/24/2021   Flatulence 05/24/2021   Abnormal renal function 05/05/2021   Increased frequency of urination 11/23/2020   Hypothyroidism 09/28/2020   Mixed hyperlipidemia 09/28/2020   Vitamin D deficiency 09/28/2020   Gas bloat syndrome 08/25/2020   Fecal smearing 08/25/2020   Change in bowel habits 08/25/2020   Sensorineural hearing loss (SNHL) of both ears 07/23/2018   Irritable bowel syndrome with diarrhea 05/30/2018   Low back pain 02/17/2013   Right knee pain 02/17/2013   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   IUD complication 06/24/2011   Graves' disease 06/24/2011   Osteopenia 06/24/2011    PCP: Shona Rush, MD  REFERRING PROVIDER: Gretta Forte, PA  REFERRING DIAG:  Diagnosis  M25.551 (ICD-10-CM) - Pain in right hip  M54.50 (ICD-10-CM) - Lumbar pain    Rationale for Evaluation and Treatment: Rehabilitation  THERAPY DIAG:  Muscle weakness (generalized)  Abnormal posture  Other low back pain  Pain in right leg  Cramp and spasm  ONSET DATE: September 2025  SUBJECTIVE:  SUBJECTIVE STATEMENT: I saw the MD and he changed my medication.  I am feeling a little better. I want to think about doing dry needling, maybe I'll try it next session.     From MD note: [name] returns today status post trigger point injection right sacral region 02/18/2024. She states she got some relief from this. But now pain is more mid sacrum area but she also describes that she is unable to lie on her right hip due to pain lateral aspect of the hip at times. No real radicular symptoms down the leg. Denies any numbness tingling. No groin pain. Difficulty ambulating due to the pain in the hip.   PERTINENT HISTORY:  Knee pain, stress incontinence  PAIN:  03/05/24 Are you having pain? Yes: NPRS scale: 3/10 Pain location: Rt sacrum, gluteals and lateral leg Pain description: hurting, intense, constant Aggravating factors: movement, standing and walking  Relieving factors: shift to Lt, not moving   PRECAUTIONS: None  RED FLAGS: None   WEIGHT BEARING RESTRICTIONS: No  FALLS:  Has patient fallen in last 6 months? No  LIVING ENVIRONMENT: Lives with: lives with their spouse Lives in: House/apartment Stairs: No Has following equipment at home: None  OCCUPATION: retired   PLOF: Independent, Vocation/Vocational requirements: retired, and Leisure: care fore chestnuts   PATIENT GOALS: reduce pain, care for [employer]  NEXT MD VISIT:   OBJECTIVE:  Note: Objective measures were completed at Evaluation unless otherwise noted.  DIAGNOSTIC FINDINGS:  02/20/24: X-ray:  AP pelvis lateral view of the right hip: Bilateral hips well located.  Hip  joints are well-maintained bilaterally.  Sclerotic changes over the  trochanteric regions left greater than right.  No acute fractures acute  findings.   PATIENT SURVEYS:  02/26/24: LEFS: 20/80=25%  COGNITION: Overall cognitive status: Within functional limits for tasks assessed     SENSATION: WFL  MUSCLE LENGTH: Hamstring length reduced by 25%   POSTURE: rounded shoulders, forward head, and weight shift left  PALPATION: Tension in Rt gluteals and pain at Rt SI joint  LUMBAR ROM:  WFLs without pain  LOWER EXTREMITY ROM:     Bil hip flexibility limited by 25% limitation bil.  Lateral Rt hip pain with end range hip adduction and flexion   LOWER EXTREMITY MMT:    4+/5 bil hips 5/5 knees, 5/5 ankles   LUMBAR SPECIAL TESTS:  Straight leg raise test: Negative  FUNCTIONAL TESTS:  Max UE support with sit to stand due to Rt LE pain  GAIT: Distance walked: 50 Assistive device utilized: None Level of assistance: Complete Independence Comments: significant antalgia with reduced  time on Rt LE   TREATMENT DATE:  03/05/24:  NuStep: Level 4x6 minutes-PT present to discuss progress Answered pt questions regarding activity and how to modify Education regarding purpose of dry needling and how it works- she will consider for next session  Seated figure 4 stretch 3x20 seconds, hamstring stretch 3x20 seconds  Sidelying clam 2x10, reverse clam x10 Sit to stand x10-emphasis on Rt=Lt  Addaday trial to Rt gluteals in Lt sidelying-PT monitored for response and discussed importance of tissue mobility.    02/26/24:  Findings from evaluation discussed, pt educated on plan of care, HEP initiated.  PATIENT EDUCATION:  Education details: Access Code: [email or web address], activity modification (03/05/24) Person educated: Patient Education method: Explanation, Demonstration, and Handouts Education comprehension: verbalized understanding and returned demonstration  HOME EXERCISE PROGRAM: Access Code: [email or web address] URL: [email or web address] Date: 03/05/2024 Prepared by: Burnard  Exercises - Seated Figure 4 Piriformis Stretch  - 3 x daily - 7 x weekly - 3 sets - 10 reps - Seated Hamstring Stretch  - 3 x daily - 7 x weekly - 3 sets - 10 reps - Supine Hip Internal and External Rotation  - 2 x daily - 7 x weekly - 1 sets - 10 reps - 5 hold - Side to Side Weight Shift with Counter Support  - 3 x daily - 7 x weekly - 1-2 sets - 10 reps - Sit to Stand Without Arm Support  - 1-2 x daily - 7 x weekly - 1 sets - 5-10 reps - Clamshell  - 1 x daily - 7 x weekly - 2 sets - 10 reps - Sidelying Reverse Clamshell  - 1 x daily - 7 x weekly - 2 sets - 10 reps  ASSESSMENT:  CLINICAL IMPRESSION: First time follow-up after evaluation.  Pt with improved gait pattern although it remains antalgic with reduced time spent on the Rt.  PT educated pt regarding appropriate  activity modification and body mechanics modifications for lumbar protections.  Trial of addaday to Rt gluteals and lateral leg and she responded well.  Patient will benefit from skilled PT to address the below impairments and improve overall function.    OBJECTIVE IMPAIRMENTS: Abnormal gait, decreased activity tolerance, difficulty walking, decreased ROM, increased fascial restrictions, impaired perceived functional ability, increased muscle spasms, improper body mechanics, postural dysfunction, and pain.   ACTIVITY LIMITATIONS: carrying, lifting, standing, squatting, transfers, hygiene/grooming, and locomotion level  PARTICIPATION LIMITATIONS: meal prep, cleaning, shopping, and community activity  PERSONAL FACTORS: Age and 1-2 comorbidities: incontinence, bursitis are also affecting patient's functional outcome.   REHAB POTENTIAL: Good  CLINICAL DECISION MAKING: Stable/uncomplicated  EVALUATION COMPLEXITY: Low   GOALS: Goals reviewed with patient? Yes  SHORT TERM GOALS: Target date: 03/25/2024    Be independent in initial HEP Baseline: Goal status: in progress   2.  Demonstrate symmetry with ambulation on level surface due to reduced pain Baseline: improved symmetry today (03/05/24) Goal status: INITIAL  3.  Perform sit to stand with Rt=Lt weightbearing and no UE support due to reduced pain Baseline:  Goal status: INITIAL  4.  Report < or = to 6/10 Rt LE pain with standing and walking  Baseline:  Goal status: INITIAL 5. Improve LEFS to > or = to 30/80   Baseline: 20/80  Goal status: INITIAL   LONG TERM GOALS: Target date: 04/22/2024    Be independent in advanced HEP Baseline:  Goal status: INITIAL  2.  Improve LEFS to > or = to 50/80  Baseline: 20/80 Goal status: INITIAL  3.  Report > or = to 70% reduction in Rt LE pain with standing and walking  Baseline:  Goal status: INITIAL  4.  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  Stand and walk without limitation at home and in the community Baseline:  Goal status: INITIAL  PLAN:  PT FREQUENCY: 2x/week  PT DURATION: 8 weeks  PLANNED INTERVENTIONS: 97110-Therapeutic exercises, 97530- Therapeutic activity, [id number]- Neuromuscular re-education, 97535- Self Care, 02859- Manual therapy, [id number]- Canalith repositioning, [id number]- Aquatic Therapy, [id number]- Electrical stimulation (unattended), [id number]-  Electrical stimulation (manual), 02966- Ionotophoresis 4mg /ml Dexamethasone , 20560 (1-2 muscles), 20561 (3+ muscles)- Dry Needling, Patient/Family education, Balance training, Stair training, Taping, Joint mobilization, Joint manipulation, Spinal manipulation, Spinal mobilization, Vestibular training, Cryotherapy, and Moist heat.  PLAN FOR NEXT SESSION: dry needling to Rt gluteals and low back if pt agrees, review HEP, work on gait and weightbearing into Rt LE, NuStep   Burnard Joy, PT 03/05/24 12:03 PM

## 2024-03-12 ENCOUNTER — Ambulatory Visit

## 2024-03-12 DIAGNOSIS — R252 Cramp and spasm: Secondary | ICD-10-CM | POA: Diagnosis not present

## 2024-03-12 DIAGNOSIS — R293 Abnormal posture: Secondary | ICD-10-CM | POA: Diagnosis not present

## 2024-03-12 DIAGNOSIS — M5459 Other low back pain: Secondary | ICD-10-CM

## 2024-03-12 DIAGNOSIS — M545 Low back pain, unspecified: Secondary | ICD-10-CM | POA: Diagnosis not present

## 2024-03-12 DIAGNOSIS — M79604 Pain in right leg: Secondary | ICD-10-CM

## 2024-03-12 DIAGNOSIS — M25551 Pain in right hip: Secondary | ICD-10-CM | POA: Diagnosis not present

## 2024-03-12 DIAGNOSIS — M6281 Muscle weakness (generalized): Secondary | ICD-10-CM | POA: Diagnosis not present

## 2024-03-12 DIAGNOSIS — R262 Difficulty in walking, not elsewhere classified: Secondary | ICD-10-CM

## 2024-03-12 NOTE — Therapy (Signed)
 OUTPATIENT PHYSICAL THERAPY TREATMENT    Patient Name: Sherry Reed MRN: [id number] DOB:05-15-1951, 73 y.o., female Today's Date: 03/12/2024  END OF SESSION:  PT End of Session - 03/12/24 0859     Visit Number 4    Number of Visits 8    Date for Recertification  04/22/24    Authorization Type [employer] [id number] vl 02/26/24-05/26/2024    PT Start Time 0848    PT Stop Time 0936    PT Time Calculation (min) 48 min    Activity Tolerance Patient tolerated treatment well    Behavior During Therapy [facility] for tasks assessed/performed           Past Medical History:  Diagnosis Date   Arthritis    knees and hands   Chronic knee pain    right, follows w/ ortho   Female cystocele    grade 2   Hearing loss    bilateral   Hypothyroidism    On thyroid  replacement   Observed sleep apnea    Status post UVPP   Pneumonia    >10 years ago as of 02/12/2023 per pt   Rectocele    grade 1   SOB (shortness of breath)    Stress incontinence    urine   Thyroid  disease    Wears glasses    Wears hearing aid    bilateral   Past Surgical History:  Procedure Laterality Date   ANTERIOR AND POSTERIOR REPAIR N/A 02/22/2023   Procedure: ANTERIOR (CYSTOCELE) AND POSTERIOR REPAIR (RECTOCELE);  Surgeon: Darcel Pool, MD;  Location: [facility];  Service: Gynecology;  Laterality: N/A;   COLONOSCOPY  07/15/2021   ascending colon tubular adenoma   THROAT SURGERY     Uvulopalatopharyngoplasty   TONSILLECTOMY     VAGINAL HYSTERECTOMY N/A 02/22/2023   Procedure: TOTAL HYSTERECTOMY VAGINAL WITH BILATERAL SALPINGECTOMY;  Surgeon: Darcel Pool, MD;  Location: [facility];  Service: Gynecology;  Laterality: N/A;   Patient Active Problem List   Diagnosis Date Noted   Abnormal weight gain 07/16/2023   Bilateral hearing loss 07/16/2023   Disorder of bone and articular cartilage 07/16/2023   Diverticulosis of colon 07/16/2023   Pelvic relaxation due to vaginal prolapse  02/22/2023   Otalgia of both ears 06/28/2022   Bloating 05/24/2021   Flatulence 05/24/2021   Abnormal renal function 05/05/2021   Increased frequency of urination 11/23/2020   Hypothyroidism 09/28/2020   Mixed hyperlipidemia 09/28/2020   Vitamin D deficiency 09/28/2020   Gas bloat syndrome 08/25/2020   Fecal smearing 08/25/2020   Change in bowel habits 08/25/2020   Sensorineural hearing loss (SNHL) of both ears 07/23/2018   Irritable bowel syndrome with diarrhea 05/30/2018   Low back pain 02/17/2013   Right knee pain 02/17/2013   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   IUD complication 06/24/2011   Graves' disease 06/24/2011   Osteopenia 06/24/2011    PCP: Shona Rush, MD  REFERRING PROVIDER: Gretta Forte, PA  REFERRING DIAG:  Diagnosis  M25.551 (ICD-10-CM) - Pain in right hip  M54.50 (ICD-10-CM) - Lumbar pain    Rationale for Evaluation and Treatment: Rehabilitation  THERAPY DIAG:  Pain in right leg  Other low back pain  Muscle weakness (generalized)  Cramp and spasm  Abnormal posture  Difficulty in walking, not elsewhere classified  ONSET DATE: September 2025  SUBJECTIVE:  SUBJECTIVE STATEMENT: Patient reports she is so much better, its a miracle really, and God.  She wants to defer dry needling for now and just exercise.     From MD note: [name] returns today status post trigger point injection right sacral region 02/18/2024. She states she got some relief from this. But now pain is more mid sacrum area but she also describes that she is unable to lie on her right hip due to pain lateral aspect of the hip at times. No real radicular symptoms down the leg. Denies any numbness tingling. No groin pain. Difficulty ambulating due to the pain in the hip.    PERTINENT HISTORY:  Knee pain, stress incontinence  PAIN: 03/12/24 Are you having pain? Yes: NPRS scale: 2.5/10 Pain location: Rt sacrum, gluteals and lateral leg Pain description: hurting, intense, constant Aggravating factors: movement, standing and walking  Relieving factors: shift to Lt, not moving   PRECAUTIONS: None  RED FLAGS: None   WEIGHT BEARING RESTRICTIONS: No  FALLS:  Has patient fallen in last 6 months? No  LIVING ENVIRONMENT: Lives with: lives with their spouse Lives in: House/apartment Stairs: No Has following equipment at home: None  OCCUPATION: retired   PLOF: Independent, Vocation/Vocational requirements: retired, and Leisure: care fore chestnuts   PATIENT GOALS: reduce pain, care for [employer]  NEXT MD VISIT:   OBJECTIVE:  Note: Objective measures were completed at Evaluation unless otherwise noted.  DIAGNOSTIC FINDINGS:  02/20/24: X-ray:  AP pelvis lateral view of the right hip: Bilateral hips well located.  Hip  joints are well-maintained bilaterally.  Sclerotic changes over the  trochanteric regions left greater than right.  No acute fractures acute  findings.   PATIENT SURVEYS:  02/26/24: LEFS: 20/80=25%  COGNITION: Overall cognitive status: Within functional limits for tasks assessed     SENSATION: WFL  MUSCLE LENGTH: Hamstring length reduced by 25%   POSTURE: rounded shoulders, forward head, and weight shift left  PALPATION: Tension in Rt gluteals and pain at Rt SI joint  LUMBAR ROM:  WFLs without pain  LOWER EXTREMITY ROM:     Bil hip flexibility limited by 25% limitation bil.  Lateral Rt hip pain with end range hip adduction and flexion   LOWER EXTREMITY MMT:    4+/5 bil hips 5/5 knees, 5/5 ankles   LUMBAR SPECIAL TESTS:  Straight leg raise test: Negative  FUNCTIONAL TESTS:  Max UE support with sit to stand due to Rt LE pain  GAIT: Distance walked: 50 Assistive device utilized: None Level of assistance:  Complete Independence Comments: significant antalgia with reduced time on Rt LE   TREATMENT DATE:  03/12/24:  NuStep: Level 5 x 6 minutes-PT present to discuss progress Lengthy amount of time spent on sit to stand re: the exercise of sit to stand without using hands vs home safety and using hands to get up and down from a chair. Completed 2 x 10.  Seated LAQ with 3 lbs 2 x 10 each LE Seated march with 3 lbs x 20 Standing with 3 lb aw in // bars hip abduction and extension 2 x 10 In // bars: lateral band walks with yellow tband Seated clam with yellow tband x 20 Supine hook lying clam with yellow tband x 20 Sidelying clam 2x10 Seated figure 4 stretch 2x20 seconds each LE  03/05/24:  NuStep: Level 4x6 minutes-PT present to discuss progress Answered pt questions regarding activity and how to modify Education regarding purpose of dry needling and how it works-  she will consider for next session  Seated figure 4 stretch 3x20 seconds, hamstring stretch 3x20 seconds  Sidelying clam 2x10, reverse clam x10 Sit to stand x10-emphasis on Rt=Lt  Addaday trial to Rt gluteals in Lt sidelying-PT monitored for response and discussed importance of tissue mobility.    02/26/24:  Findings from evaluation discussed, pt educated on plan of care, HEP initiated.                                                                                                                                   PATIENT EDUCATION:  Education details: Access Code: [email or web address], activity modification (03/05/24) Person educated: Patient Education method: Explanation, Demonstration, and Handouts Education comprehension: verbalized understanding and returned demonstration  HOME EXERCISE PROGRAM: Access Code: [email or web address] URL: [email or web address] Date: 03/05/2024 Prepared by: Burnard  Exercises - Seated Figure 4 Piriformis Stretch  - 3 x daily - 7 x weekly - 3 sets - 10 reps - Seated Hamstring Stretch  - 3 x daily -  7 x weekly - 3 sets - 10 reps - Supine Hip Internal and External Rotation  - 2 x daily - 7 x weekly - 1 sets - 10 reps - 5 hold - Side to Side Weight Shift with Counter Support  - 3 x daily - 7 x weekly - 1-2 sets - 10 reps - Sit to Stand Without Arm Support  - 1-2 x daily - 7 x weekly - 1 sets - 5-10 reps - Clamshell  - 1 x daily - 7 x weekly - 2 sets - 10 reps - Sidelying Reverse Clamshell  - 1 x daily - 7 x weekly - 2 sets - 10 reps  ASSESSMENT:  CLINICAL IMPRESSION: Tranise has improved significantly since last visit.  She was able to do more isolated hip strengthening.  She is well motivated but does require some extra clarification on each exercise.  She should continue to do well.   Patient will benefit from skilled PT to address the below impairments and improve overall function.    OBJECTIVE IMPAIRMENTS: Abnormal gait, decreased activity tolerance, difficulty walking, decreased ROM, increased fascial restrictions, impaired perceived functional ability, increased muscle spasms, improper body mechanics, postural dysfunction, and pain.   ACTIVITY LIMITATIONS: carrying, lifting, standing, squatting, transfers, hygiene/grooming, and locomotion level  PARTICIPATION LIMITATIONS: meal prep, cleaning, shopping, and community activity  PERSONAL FACTORS: Age and 1-2 comorbidities: incontinence, bursitis are also affecting patient's functional outcome.   REHAB POTENTIAL: Good  CLINICAL DECISION MAKING: Stable/uncomplicated  EVALUATION COMPLEXITY: Low   GOALS: Goals reviewed with patient? Yes  SHORT TERM GOALS: Target date: 03/25/2024    Be independent in initial HEP Baseline: Goal status: in progress   2.  Demonstrate symmetry with ambulation on level surface due to reduced pain Baseline: improved symmetry today (03/05/24) Goal status: In progress  3.  Perform sit to stand with Rt=Lt weightbearing and no UE support due to reduced pain Baseline:  Goal status: MET 03/12/24  4.   Report < or = to 6/10 Rt LE pain with standing and walking  Baseline:  Goal status: INITIAL 5. Improve LEFS to > or = to 30/80   Baseline: 20/80  Goal status: INITIAL   LONG TERM GOALS: Target date: 04/22/2024    Be independent in advanced HEP Baseline:  Goal status: INITIAL  2.  Improve LEFS to > or = to 50/80  Baseline: 20/80 Goal status: INITIAL  3.  Report > or = to 70% reduction in Rt LE pain with standing and walking  Baseline:  Goal status: INITIAL  4.  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  Stand and walk without limitation at home and in the community Baseline:  Goal status: INITIAL  PLAN:  PT FREQUENCY: 2x/week  PT DURATION: 8 weeks  PLANNED INTERVENTIONS: 97110-Therapeutic exercises, 97530- Therapeutic activity, 97112- Neuromuscular re-education, 97535- Self Care, 02859- Manual therapy, [phone]- Canalith repositioning, [id number]- Aquatic Therapy, G0283- Electrical stimulation (unattended), [phone]- Electrical stimulation (manual), [id number]- Ionotophoresis 4mg /ml Dexamethasone , 79439 (1-2 muscles), 20561 (3+ muscles)- Dry Needling, Patient/Family education, Balance training, Stair training, Taping, Joint mobilization, Joint manipulation, Spinal manipulation, Spinal mobilization, Vestibular training, Cryotherapy, and Moist heat.  PLAN FOR NEXT SESSION: dry needling to Rt gluteals and low back if pt expresses need (she was feeling so much better today that she did not feel she needed it today), review HEP, work on gait and weightbearing into Rt LE, NuStep   Kendallyn Lippold B. Obrian Bulson, PT 03/12/24 9:41 AM [facility] [address] [place], [place] [address] Phone # [phone] Fax [phone]

## 2024-03-17 ENCOUNTER — Encounter: Payer: Self-pay | Admitting: Radiology

## 2024-03-19 ENCOUNTER — Ambulatory Visit: Attending: Physician Assistant

## 2024-03-19 DIAGNOSIS — M6281 Muscle weakness (generalized): Secondary | ICD-10-CM | POA: Insufficient documentation

## 2024-03-19 DIAGNOSIS — R293 Abnormal posture: Secondary | ICD-10-CM | POA: Diagnosis not present

## 2024-03-19 DIAGNOSIS — R252 Cramp and spasm: Secondary | ICD-10-CM | POA: Diagnosis not present

## 2024-03-19 DIAGNOSIS — M5459 Other low back pain: Secondary | ICD-10-CM | POA: Diagnosis not present

## 2024-03-19 DIAGNOSIS — R262 Difficulty in walking, not elsewhere classified: Secondary | ICD-10-CM | POA: Insufficient documentation

## 2024-03-19 DIAGNOSIS — M79604 Pain in right leg: Secondary | ICD-10-CM | POA: Diagnosis not present

## 2024-03-19 NOTE — Therapy (Signed)
 OUTPATIENT PHYSICAL THERAPY TREATMENT    Patient Name: Sherry Reed MRN: [id number] DOB:[birth year], 73 y.o., female Today's Date: 03/19/2024  END OF SESSION:  PT End of Session - 03/19/24 1027     Visit Number 5    Number of Visits 8    Date for Recertification  04/22/24    Authorization Type [employer] [id number]-16 vl 02/26/24-05/26/2024    Progress Note Due on Visit 10    PT Start Time 1020    PT Stop Time 1104    PT Time Calculation (min) 44 min    Activity Tolerance Patient tolerated treatment well    Behavior During Therapy WFL for tasks assessed/performed           Past Medical History:  Diagnosis Date   Arthritis    knees and hands   Chronic knee pain    right, follows w/ ortho   Female cystocele    grade 2   Hearing loss    bilateral   Hypothyroidism    On thyroid  replacement   Observed sleep apnea    Status post UVPP   Pneumonia    >10 years ago as of 02/12/2023 per pt   Rectocele    grade 1   SOB (shortness of breath)    Stress incontinence    urine   Thyroid  disease    Wears glasses    Wears hearing aid    bilateral   Past Surgical History:  Procedure Laterality Date   ANTERIOR AND POSTERIOR REPAIR N/A 02/22/2023   Procedure: ANTERIOR (CYSTOCELE) AND POSTERIOR REPAIR (RECTOCELE);  Surgeon: Darcel Pool, MD;  Location: [facility];  Service: Gynecology;  Laterality: N/A;   COLONOSCOPY  07/15/2021   ascending colon tubular adenoma   THROAT SURGERY     Uvulopalatopharyngoplasty   TONSILLECTOMY     VAGINAL HYSTERECTOMY N/A 02/22/2023   Procedure: TOTAL HYSTERECTOMY VAGINAL WITH BILATERAL SALPINGECTOMY;  Surgeon: Darcel Pool, MD;  Location: [facility];  Service: Gynecology;  Laterality: N/A;   Patient Active Problem List   Diagnosis Date Noted   Abnormal weight gain 07/16/2023   Bilateral hearing loss 07/16/2023   Disorder of bone and articular cartilage 07/16/2023   Diverticulosis of colon 07/16/2023   Pelvic  relaxation due to vaginal prolapse 02/22/2023   Otalgia of both ears 06/28/2022   Bloating 05/24/2021   Flatulence 05/24/2021   Abnormal renal function 05/05/2021   Increased frequency of urination 11/23/2020   Hypothyroidism 09/28/2020   Mixed hyperlipidemia 09/28/2020   Vitamin D deficiency 09/28/2020   Gas bloat syndrome 08/25/2020   Fecal smearing 08/25/2020   Change in bowel habits 08/25/2020   Sensorineural hearing loss (SNHL) of both ears 07/23/2018   Irritable bowel syndrome with diarrhea 05/30/2018   Low back pain 02/17/2013   Right knee pain 02/17/2013   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   IUD complication 06/24/2011   Graves' disease 06/24/2011   Osteopenia 06/24/2011    PCP: Shona Rush, MD  REFERRING PROVIDER: Gretta Forte, PA  REFERRING DIAG:  Diagnosis  M25.551 (ICD-10-CM) - Pain in right hip  M54.50 (ICD-10-CM) - Lumbar pain    Rationale for Evaluation and Treatment: Rehabilitation  THERAPY DIAG:  Other low back pain  Muscle weakness (generalized)  Cramp and spasm  Difficulty in walking, not elsewhere classified  Abnormal posture  ONSET DATE: September 2025  SUBJECTIVE:  SUBJECTIVE STATEMENT: Patient reports she has experienced a set back.  My back is hurting I was in the car for a long period of time over the weekend and then when she got back, she had to go to [place].  Just sore in the low back.  Pain reported at 3/10.  But hurts worse when I'm standing or walking    From MD note: [name] returns today status post trigger point injection right sacral region 02/18/2024. She states she got some relief from this. But now pain is more mid sacrum area but she also describes that she is unable to lie on her right hip due to pain lateral aspect  of the hip at times. No real radicular symptoms down the leg. Denies any numbness tingling. No groin pain. Difficulty ambulating due to the pain in the hip.   PERTINENT HISTORY:  Knee pain, stress incontinence  PAIN: 03/19/24 Are you having pain? Yes: NPRS scale: 3/10 Pain location: Rt sacrum, gluteals and lateral leg Pain description: hurting, intense, constant Aggravating factors: movement, standing and walking  Relieving factors: shift to Lt, not moving   PRECAUTIONS: None  RED FLAGS: None   WEIGHT BEARING RESTRICTIONS: No  FALLS:  Has patient fallen in last 6 months? No  LIVING ENVIRONMENT: Lives with: lives with their spouse Lives in: House/apartment Stairs: No Has following equipment at home: None  OCCUPATION: retired   PLOF: Independent, Vocation/Vocational requirements: retired, and Leisure: care fore chestnuts   PATIENT GOALS: reduce pain, care for [employer]  NEXT MD VISIT:   OBJECTIVE:  Note: Objective measures were completed at Evaluation unless otherwise noted.  DIAGNOSTIC FINDINGS:  02/20/24: X-ray:  AP pelvis lateral view of the right hip: Bilateral hips well located.  Hip  joints are well-maintained bilaterally.  Sclerotic changes over the  trochanteric regions left greater than right.  No acute fractures acute  findings.   PATIENT SURVEYS:  02/26/24: LEFS: 20/80=25%  COGNITION: Overall cognitive status: Within functional limits for tasks assessed     SENSATION: WFL  MUSCLE LENGTH: Hamstring length reduced by 25%   POSTURE: rounded shoulders, forward head, and weight shift left  PALPATION: Tension in Rt gluteals and pain at Rt SI joint  LUMBAR ROM:  WFLs without pain  LOWER EXTREMITY ROM:     Bil hip flexibility limited by 25% limitation bil.  Lateral Rt hip pain with end range hip adduction and flexion   LOWER EXTREMITY MMT:    4+/5 bil hips 5/5 knees, 5/5 ankles   LUMBAR SPECIAL TESTS:  Straight leg raise test:  Negative  FUNCTIONAL TESTS:  Max UE support with sit to stand due to Rt LE pain  GAIT: Distance walked: 50 Assistive device utilized: None Level of assistance: Complete Independence Comments: significant antalgia with reduced time on Rt LE   TREATMENT DATE:  03/19/24:  NuStep: Level 5 x 5 minutes-PT present to discuss progress Standing hamstring stretch at stairs 3 x 30 sec each LE Standing quad/hip flexor stretch 3 x 30 sec Seated piriformis stretch 3 x 30 sec Sit to stand x 10 with 5 lb kb Seated LAQ with 3 lbs 2 x 10 each LE Seated march with 3 lbs x 20 At counter: lateral band walks with yellow tband Supine PPT x 20 Supine PPT with 90/90 heel tap x 20 Supine PPT with dying bug x 20  03/12/24:  NuStep: Level 5 x 6 minutes-PT present to discuss progress Lengthy amount of time spent on sit to stand re:  the exercise of sit to stand without using hands vs home safety and using hands to get up and down from a chair. Completed 2 x 10.  Seated LAQ with 3 lbs 2 x 10 each LE Seated march with 3 lbs x 20 Standing with 3 lb aw in // bars hip abduction and extension 2 x 10 In // bars: lateral band walks with yellow tband Seated clam with yellow tband x 20 Supine hook lying clam with yellow tband x 20 Sidelying clam 2x10 Seated figure 4 stretch 2x20 seconds each LE  03/05/24:  NuStep: Level 4x6 minutes-PT present to discuss progress Answered pt questions regarding activity and how to modify Education regarding purpose of dry needling and how it works- she will consider for next session  Seated figure 4 stretch 3x20 seconds, hamstring stretch 3x20 seconds  Sidelying clam 2x10, reverse clam x10 Sit to stand x10-emphasis on Rt=Lt  Addaday trial to Rt gluteals in Lt sidelying-PT monitored for response and discussed importance of tissue mobility.    02/26/24:  Findings from evaluation discussed, pt educated on plan of care, HEP initiated.                                                                                                                                    PATIENT EDUCATION:  Education details: Access Code: [email or web address], activity modification (03/05/24) Person educated: Patient Education method: Explanation, Demonstration, and Handouts Education comprehension: verbalized understanding and returned demonstration  HOME EXERCISE PROGRAM: Access Code: [email or web address] URL: [email or web address] Date: 03/19/2024 Prepared by: Delon Haddock  Exercises - Seated Figure 4 Piriformis Stretch  - 3 x daily - 7 x weekly - 3 sets - 10 reps - Seated Hamstring Stretch  - 3 x daily - 7 x weekly - 3 sets - 10 reps - Supine Hip Internal and External Rotation  - 2 x daily - 7 x weekly - 1 sets - 10 reps - 5 hold - Side to Side Weight Shift with Counter Support  - 3 x daily - 7 x weekly - 1-2 sets - 10 reps - Sit to Stand Without Arm Support  - 1-2 x daily - 7 x weekly - 1 sets - 5-10 reps - Clamshell  - 1 x daily - 7 x weekly - 2 sets - 10 reps - Sidelying Reverse Clamshell  - 1 x daily - 7 x weekly - 2 sets - 10 reps - Supine Posterior Pelvic Tilt  - 1 x daily - 7 x weekly - 1 sets - 20 reps - Supine 90/90 Alternating Heel Touches with Posterior Pelvic Tilt  - 1 x daily - 7 x weekly - 1 sets - 20 reps - Supine Dead Bug with Leg Extension  - 1 x daily - 7 x weekly - 1 sets - 20 reps  ASSESSMENT:  CLINICAL IMPRESSION: Marketa had somewhat of a set back with some traveling she was doing.  However,  she did all tasks today with very little discomfort or signs of weakness.  We added resistance to functional sit to stand.  She needs mod vc's for correct technique and has questions about most of the exercises but this was improved today.  She is well motivated and compliant.  She should continue to do well.   Patient will benefit from skilled PT to address the below impairments and improve overall function.    OBJECTIVE IMPAIRMENTS: Abnormal gait, decreased activity tolerance,  difficulty walking, decreased ROM, increased fascial restrictions, impaired perceived functional ability, increased muscle spasms, improper body mechanics, postural dysfunction, and pain.   ACTIVITY LIMITATIONS: carrying, lifting, standing, squatting, transfers, hygiene/grooming, and locomotion level  PARTICIPATION LIMITATIONS: meal prep, cleaning, shopping, and community activity  PERSONAL FACTORS: Age and 1-2 comorbidities: incontinence, bursitis are also affecting patient's functional outcome.   REHAB POTENTIAL: Good  CLINICAL DECISION MAKING: Stable/uncomplicated  EVALUATION COMPLEXITY: Low   GOALS: Goals reviewed with patient? Yes  SHORT TERM GOALS: Target date: 03/25/2024    Be independent in initial HEP Baseline: Goal status: in progress   2.  Demonstrate symmetry with ambulation on level surface due to reduced pain Baseline: improved symmetry today (03/05/24) Goal status: In progress  3.  Perform sit to stand with Rt=Lt weightbearing and no UE support due to reduced pain Baseline:  Goal status: MET 03/12/24  4.  Report < or = to 6/10 Rt LE pain with standing and walking  Baseline:  Goal status: INITIAL 5. Improve LEFS to > or = to 30/80   Baseline: 20/80  Goal status: INITIAL   LONG TERM GOALS: Target date: 04/22/2024    Be independent in advanced HEP Baseline:  Goal status: INITIAL  2.  Improve LEFS to > or = to 50/80  Baseline: 20/80 Goal status: INITIAL  3.  Report > or = to 70% reduction in Rt LE pain with standing and walking  Baseline:  Goal status: INITIAL  4.  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  Stand and walk without limitation at home and in the community Baseline:  Goal status: INITIAL  PLAN:  PT FREQUENCY: 2x/week  PT DURATION: 8 weeks  PLANNED INTERVENTIONS: 97110-Therapeutic exercises, 97530- Therapeutic activity, 97112- Neuromuscular re-education, 97535- Self  Care, 02859- Manual therapy, [phone]- Canalith repositioning, [id number]- Aquatic Therapy, [phone]- Electrical stimulation (unattended), [phone]- Electrical stimulation (manual), [id number]- Ionotophoresis 4mg /ml Dexamethasone , 20560 (1-2 muscles), 20561 (3+ muscles)- Dry Needling, Patient/Family education, Balance training, Stair training, Taping, Joint mobilization, Joint manipulation, Spinal manipulation, Spinal mobilization, Vestibular training, Cryotherapy, and Moist heat.  PLAN FOR NEXT SESSION: LE flexibility, core strengthening, hip strengthening and stability training, dry needling to Rt gluteals and low back if pt expresses need , work on gait and weightbearing into Rt LE, NuStep   Micaylah Bertucci B. Jalah Warmuth, PT 03/19/24 11:11 AM [address] [facility] [address] [place], [place] [address] Phone # [phone] Fax [phone]

## 2024-03-20 ENCOUNTER — Ambulatory Visit: Attending: Obstetrics and Gynecology | Admitting: Physical Therapy

## 2024-03-20 DIAGNOSIS — R293 Abnormal posture: Secondary | ICD-10-CM | POA: Insufficient documentation

## 2024-03-20 DIAGNOSIS — R262 Difficulty in walking, not elsewhere classified: Secondary | ICD-10-CM | POA: Insufficient documentation

## 2024-03-20 DIAGNOSIS — M6281 Muscle weakness (generalized): Secondary | ICD-10-CM | POA: Insufficient documentation

## 2024-03-20 DIAGNOSIS — R252 Cramp and spasm: Secondary | ICD-10-CM | POA: Insufficient documentation

## 2024-03-20 DIAGNOSIS — R279 Unspecified lack of coordination: Secondary | ICD-10-CM | POA: Diagnosis not present

## 2024-03-20 DIAGNOSIS — M79604 Pain in right leg: Secondary | ICD-10-CM | POA: Diagnosis not present

## 2024-03-20 DIAGNOSIS — M5459 Other low back pain: Secondary | ICD-10-CM | POA: Insufficient documentation

## 2024-03-20 NOTE — Patient Instructions (Addendum)
 Urge Incontinence  Ideal urination frequency is every 2-4 wakeful hours, which equates to 5-8 times within a 24-hour period.   Urge incontinence is leakage that occurs when the bladder muscle contracts, creating a sudden need to go before getting to the bathroom.   Going too often when your bladder isn't actually full can disrupt the body's automatic signals to store and hold urine longer, which will increase urgency/frequency.  In this case, the bladder "is running the show" and strategies can be learned to retrain this pattern.   One should be able to control the first urge to urinate, at around .  The bladder can hold up to a "grande latte," or . To help you gain control, practice the Urge Drill below when urgency strikes.  This drill will help retrain your bladder signals and allow you to store and hold urine longer.  The overall goal is to stretch out your time between voids to reach a more manageable voiding schedule.    Practice your quick flicks often throughout the day (each waking hour) even when you don't need feel the urge to go.  This will help strengthen your pelvic floor muscles, making them more effective in controlling leakage.  Urge Drill  When you feel an urge to go, follow these steps to regain control: Stop what you are doing and be still Take one deep breath, directing your air into your abdomen Think an affirming thought, such as "I've got this." Do 5 quick flicks of your pelvic floor Walk with control to the bathroom to void, or delay voiding  Toileting Mechanics 101: Urination  Positioning: sit all the way down on the toilet, feet flat on the floor, trunk relaxed Hovering over the toilet seat can impact your ability to relax the pelvic floor musculature and empty your bladder well  When you initiate urination, try to let the urine flow out naturally rather than pushing down. Pushing can limit the ability of your urethral sphincter to relax and open. Double  voiding technique: When you think you are done urinating, try gently pushing to see if there is any remaining urine that can be expelled   You can try the following techniques to see if there is any remaining urine that can be expelled: Rocking back and forth Leaning side to side  Twisting your trunk  Taking deep belly breaths to promote blood flow to pelvic floor musculature  Defecation  Positioning: sit all the way down on the toilet, feet flat on the floor, trunk relaxed You can try using a squatty potty or toilet stool under your feet to change the angle of your rectum in your pelvic floor, making it easier to pass stool with less straining  Avoid straining or breath holding while on the toilet. This causes increased intra-abdominal pressure, which causes increased pressure down through the pelvic floor. We want to avoid this if possible. If you do feel the need to push to pass bowel movements, practice the following technique instead: Imagine you are fogging up a mirror with your breath as you exhale and gently push down and out through your pelvic floor. This relieves some pressure while you're still able to push the stool out.  You can try the following techniques to see if there is any remaining stool that can be expelled: Rocking back and forth Leaning side to side  Twisting your trunk  Taking deep belly breaths to promote blood flow to pelvic floor musculature

## 2024-03-20 NOTE — Therapy (Signed)
 OUTPATIENT PHYSICAL THERAPY FEMALE PELVIC TREATMENT   Patient Name: Sherry Reed MRN: [id number] DOB:1951-05-09, 73 y.o., female Today's Date: 03/20/2024  END OF SESSION:  PT End of Session - 03/20/24 1653     Visit Number 6    Number of Visits 8    Date for Recertification  04/22/24    Authorization Type [employer] [id number] vl 02/26/24-05/26/2024    Authorization - Visit Number 6    Authorization - Number of Visits 16    Progress Note Due on Visit 10    PT Start Time 0415    PT Stop Time 0500    PT Time Calculation (min) 45 min    Activity Tolerance Patient tolerated treatment well    Behavior During Therapy [facility] for tasks assessed/performed           Past Medical History:  Diagnosis Date   Arthritis    knees and hands   Chronic knee pain    right, follows w/ ortho   Female cystocele    grade 2   Hearing loss    bilateral   Hypothyroidism    On thyroid  replacement   Observed sleep apnea    Status post UVPP   Pneumonia    >10 years ago as of 02/12/2023 per pt   Rectocele    grade 1   SOB (shortness of breath)    Stress incontinence    urine   Thyroid  disease    Wears glasses    Wears hearing aid    bilateral   Past Surgical History:  Procedure Laterality Date   ANTERIOR AND POSTERIOR REPAIR N/A 02/22/2023   Procedure: ANTERIOR (CYSTOCELE) AND POSTERIOR REPAIR (RECTOCELE);  Surgeon: Darcel Pool, MD;  Location: [facility] [place];  Service: Gynecology;  Laterality: N/A;   COLONOSCOPY  07/15/2021   ascending colon tubular adenoma   THROAT SURGERY     Uvulopalatopharyngoplasty   TONSILLECTOMY     VAGINAL HYSTERECTOMY N/A 02/22/2023   Procedure: TOTAL HYSTERECTOMY VAGINAL WITH BILATERAL SALPINGECTOMY;  Surgeon: Darcel Pool, MD;  Location: [facility];  Service: Gynecology;  Laterality: N/A;   Patient Active Problem List   Diagnosis Date Noted   Abnormal weight gain 07/16/2023   Bilateral hearing loss 07/16/2023   Disorder of  bone and articular cartilage 07/16/2023   Diverticulosis of colon 07/16/2023   Pelvic relaxation due to vaginal prolapse 02/22/2023   Otalgia of both ears 06/28/2022   Bloating 05/24/2021   Flatulence 05/24/2021   Abnormal renal function 05/05/2021   Increased frequency of urination 11/23/2020   Hypothyroidism 09/28/2020   Mixed hyperlipidemia 09/28/2020   Vitamin D deficiency 09/28/2020   Gas bloat syndrome 08/25/2020   Fecal smearing 08/25/2020   Change in bowel habits 08/25/2020   Sensorineural hearing loss (SNHL) of both ears 07/23/2018   Irritable bowel syndrome with diarrhea 05/30/2018   Low back pain 02/17/2013   Right knee pain 02/17/2013   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   IUD complication 06/24/2011   Graves' disease 06/24/2011   Osteopenia 06/24/2011   PCP: Shona Norleen PEDLAR, MD  REFERRING PROVIDER: Darcel Pool, MD   REFERRING DIAG: M62.89 (ICD-10-CM) - Other specified disorders of muscle  THERAPY DIAG:  Muscle weakness (generalized)  Unspecified lack of coordination  Rationale for Evaluation and Treatment: Rehabilitation  ONSET DATE: fecal incontinence started 3 months ago, bladder issues for years   SUBJECTIVE:  SUBJECTIVE STATEMENT: Patient reports that she is doing well today. She has noticed that when she drinks sweet tea, there is more urinary frequency. No major episodes of urinary leakage to report. The other day, she went to [employer] and she ate some hashbrown casserole and this caused her to have bowel leakage when trying to get out of the car. Stress urinary incontinence is not an issue for the patient.   From eval: Patient reports to PFPT with pelvic floor dysfunction that she learned about from her OBGYN. She is hoping to get stronger in the pelvic  floor to prevent urinary leakage and bowel leakage.  Fluid intake: water  or unsweet tea throughout the day   PERTINENT HISTORY:  Medications for current condition: no longer taking estradiol   Surgeries: hysterectomy last Christmas  Other: N/A Sexual abuse: No  PAIN:  Are you having pain? No NPRS scale: 0/10  PRECAUTIONS: None  RED FLAGS: None   WEIGHT BEARING RESTRICTIONS: No  FALLS:  Has patient fallen in last 6 months? No  OCCUPATION: retired but raises chestnuts   ACTIVITY LEVEL : constantly on her feet, no exercise regimen   PLOF: Independent with basic ADLs  PATIENT GOALS: improved urinary control, strengthen the pelvic floor, prevent fecal incontinence   BOWEL MOVEMENT: Pain with bowel movement: No Type of bowel movement:Type (Bristol Stool Scale) 2-3 , Frequency within normal limits , Strain no, and Splinting no Fully empty rectum: No Leakage: Yes: looser in nature, small amount                                                       Caused by: unknown  Pads: Yes: 1x/day Fiber supplement/laxative Yes - probiotic   URINATION: Pain with urination: No Fully empty bladder: Yes:                                  Post-void dribble: No Stream: Strong Urgency: Yes  Frequency:during the day within normal limits                                                          Nocturia: Yes: 1-2x/night   Leakage: Urge to void Pads/briefs: Yes: 1x/night  INTERCOURSE: currently sexually active   Ability to have vaginal penetration Yes  Pain with intercourse: Initial Penetration, During Penetration, and Deep Penetration Dryness: Yes  *tightness present with intercourse, coconut oil is not helping   PREGNANCY: Vaginal deliveries 2 Tearing No Episiotomy No C-section deliveries 0 Currently pregnant No  PROLAPSE: None  OBJECTIVE:  Note: Objective measures were completed at Evaluation unless otherwise noted. PATIENT SURVEYS:  PFIQ-7: 45  COGNITION: Overall cognitive  status: Within functional limits for tasks assessed     SENSATION: Light touch: Appears intact  LUMBAR SPECIAL TESTS:  Single leg stance test: Positive  FUNCTIONAL TESTS:  Single leg stance:  Rt: +  Lt: + Sit-up test: 1/3  Squat: stiffness present with pain in the left hip  Bed mobility: within normal limits   GAIT: Assistive device utilized: None Comments: moderate trendelenburg gait pattern with ambulation  POSTURE: rounded shoulders and forward head  LUMBARAROM/PROM: within normal limits for all motions bilaterally   LOWER EXTREMITY ROM: within functional limits for all motions with no pain bilaterally   LOWER EXTREMITY MMT: 3+/5 bilateral gluteal and hip musculature with pain in left hip  PALPATION:  General: no tenderness to palpation of bilateral adductors or hip flexors   Pelvic Alignment: within normal limits   Abdominal: abdominal bracing at rest  Diastasis: No Distortion: No  Breathing: apical breathing pattern with decreased lower rib excursion with inhalation  Scar tissue: Yes:                  External Perineal Exam: dryness present with sufficient clitoral hood mobility                              Internal Pelvic Floor: Patient fully consents to today's internal examination. She has normal tone throughout bilateral aspects of superficial and deep pelvic floor musculature, no palpable trigger points. She has a shallow vaginal canal and had no pain during today's exam. She has a weakened pelvic floor contraction that is limited in range of motion due to stiffness. Lack of coordination present between diaphragm and pelvic floor. No pain following today's exam.   Patient confirms identification and approves PT to assess internal pelvic floor and treatment Yes No emotional/communication barriers or cognitive limitation. Patient is motivated to learn. Patient understands and agrees with treatment goals and plan. PT explains patient will be examined in standing,  sitting, and lying down to see how their muscles and joints work. When they are ready, they will be asked to remove their underwear so PT can examine their perineum. The patient is also given the option of providing their own chaperone as one is not provided in our facility. The patient also has the right and is explained the right to defer or refuse any part of the evaluation or treatment including the internal exam. With the patient's consent, PT will use one gloved finger to gently assess the muscles of the pelvic floor, seeing how well it contracts and relaxes and if there is muscle symmetry. After, the patient will get dressed and PT and patient will discuss exam findings and plan of care. PT and patient discuss plan of care, schedule, attendance policy and HEP activities.  PELVIC MMT:   MMT eval  Vaginal 2/5, 10 quick flicks, 3 second hold   Internal Anal Sphincter   External Anal Sphincter   Puborectalis   Diastasis Recti   (Blank rows = not tested)  TONE: Within normal limits bilaterally in superficial and deep pelvic floor musculature   PROLAPSE: None present in hooklying   TODAY'S TREATMENT:                                                                                                                              DATE:   EVAL  02/14/24: Examination completed, findings reviewed, pt educated on POC, HEP, and self care. Pt motivated to participate in PT and agreeable to attempt recommendations.   Hooklying diaphragmatic breathing + pelvic floor lengthening with inhalation + shortening with exhalation 2x10  Hooklying quick flick pelvic floor contractions + diaphragmatic breathing 2x10   03/20/24: Seated diaphragmatic breathing + pelvic floor lengthening with inhalation + shortening with exhalation 2x10  seated quick flick pelvic floor contractions + diaphragmatic breathing 2x10 Sit to stand + pelvic floor contraction + diaphragmatic breathing 2x10 (to build upon orthopedic HEP  also) Urge drill for managing urge urinary incontinence  Toileting mechanics for optimal emptying of bladder and bowels  Defecation  Urination  Double voiding technique  Blow as you go technique    PATIENT EDUCATION:  Education details: relative anatomy and the connection between the diaphragm and pelvic floor Person educated: Patient Education method: Explanation, Demonstration, Tactile cues, Verbal cues, and Handouts Education comprehension: verbalized understanding, returned demonstration, verbal cues required, tactile cues required, and needs further education  HOME EXERCISE PROGRAM: Access Code: [email or web address] URL: [email or web address] Date: 02/14/2024 Prepared by: Celena Domino  Exercises - Supine Pelvic Floor Contraction  - 1 x daily - 7 x weekly - 3 sets - 10 reps - Quick Flick Pelvic Floor Contractions in Hooklying  - 1 x daily - 7 x weekly - 3 sets - 10 reps  Patient Education - Get To Know Your Pelvic Floor- Female - Urinary Urge Control Techniques - Bowel Emptying Techniques  ASSESSMENT:  CLINICAL IMPRESSION: Patient is a 73 y.o. female  who was seen today for physical therapy treatment for urinary and fecal incontinence. Patient reports that her leakage has been better. Her bowel urgency still happens occasionally when she eats the wrong food triggers. Urge drill reviewed for managing urgency when out and about. She was educated on ways to make her current orthopedic HEP pelvic floor friendly with exhalation and contractions. Pt tolerated session well and Pt would benefit from additional PT to further address deficits.    OBJECTIVE IMPAIRMENTS: decreased coordination, decreased endurance, decreased mobility, difficulty walking, decreased ROM, decreased strength, and pain.   ACTIVITY LIMITATIONS: continence and toileting  PARTICIPATION LIMITATIONS: community activity  PERSONAL FACTORS: Age, Past/current experiences, and Time since onset of  injury/illness/exacerbation are also affecting patient's functional outcome.   REHAB POTENTIAL: Good  CLINICAL DECISION MAKING: Stable/uncomplicated  EVALUATION COMPLEXITY: Low   GOALS: Goals reviewed with patient? Yes  SHORT TERM GOALS: Target date: 03/13/2024  Pt will be independent with HEP.  Baseline: Goal status: INITIAL  2.  Pt will be independent with the knack, urge suppression technique, and double voiding in order to improve bladder habits and decrease urinary incontinence.  Baseline:  Goal status: INITIAL  3.  Pt will be independent with use of squatty potty, relaxed toileting mechanics, and improved bowel movement techniques in order to increase ease of bowel movements and complete evacuation.  Baseline:  Goal status: INITIAL  LONG TERM GOALS: Target date: 08/14/2024  Pt will be independent with advanced HEP.  Baseline:  Goal status: INITIAL  2.  Pt to demonstrate improved coordination of pelvic floor and breathing mechanics with 10# squat with appropriate synergistic patterns to decrease pain and leakage at least 75% of the time for improved ability to complete a 30 minute workout with strain at pelvic floor and symptoms.  Baseline:  Goal status: INITIAL  3.  Pt will report her BMs are complete due to improved bowel habits and evacuation techniques.  Baseline:  Goal status: INITIAL  4.  Pt will have greater than 5 bowel movements a week in order to feel more comfortable and decrease pressure on urinogenital system.   Baseline:  Goal status: INITIAL  5.  Pt will report 0/10 pain with vaginal penetration in order to improve intimate relationship with partner.    Baseline:  Goal status: INITIAL  PLAN:  PT FREQUENCY: 1-2x/week  PT DURATION: 6 months  PLANNED INTERVENTIONS: 97110-Therapeutic exercises, 97530- Therapeutic activity, 97112- Neuromuscular re-education, 97535- Self Care, [id number]- Manual therapy, Patient/Family education, Taping, Joint  mobilization, Spinal mobilization, Scar mobilization, Cryotherapy, Moist heat, and Biofeedback  PLAN FOR NEXT SESSION: continued pelvic floor training in seated, introduce toileting mechanics for optimal emptying, urge drill and knack drill, hip/glute/core strengthening  Celena JAYSON Domino, PT 03/20/2024, 4:55 PM

## 2024-03-21 ENCOUNTER — Ambulatory Visit

## 2024-03-21 DIAGNOSIS — M6281 Muscle weakness (generalized): Secondary | ICD-10-CM

## 2024-03-21 DIAGNOSIS — M5459 Other low back pain: Secondary | ICD-10-CM | POA: Diagnosis not present

## 2024-03-21 DIAGNOSIS — R279 Unspecified lack of coordination: Secondary | ICD-10-CM | POA: Diagnosis not present

## 2024-03-21 DIAGNOSIS — M79604 Pain in right leg: Secondary | ICD-10-CM

## 2024-03-21 DIAGNOSIS — R252 Cramp and spasm: Secondary | ICD-10-CM

## 2024-03-21 DIAGNOSIS — R293 Abnormal posture: Secondary | ICD-10-CM | POA: Diagnosis not present

## 2024-03-21 DIAGNOSIS — R262 Difficulty in walking, not elsewhere classified: Secondary | ICD-10-CM | POA: Diagnosis not present

## 2024-03-21 NOTE — Therapy (Signed)
 OUTPATIENT PHYSICAL THERAPY TREATMENT    Patient Name: Sherry Reed MRN: [id number] DOB:June 22, 1950, 73 y.o., female Today's Date: 03/21/2024  END OF SESSION:  PT End of Session - 03/21/24 0935     Visit Number 7    Number of Visits 16    Date for Recertification  04/22/24    Authorization Type [employer] [id number] vl 02/26/24-05/26/2024    Authorization - Visit Number 7    Authorization - Number of Visits 16    Progress Note Due on Visit 10    PT Start Time 0932    PT Stop Time 1015    PT Time Calculation (min) 43 min    Activity Tolerance Patient tolerated treatment well    Behavior During Therapy WFL for tasks assessed/performed           Past Medical History:  Diagnosis Date   Arthritis    knees and hands   Chronic knee pain    right, follows w/ ortho   Female cystocele    grade 2   Hearing loss    bilateral   Hypothyroidism    On thyroid  replacement   Observed sleep apnea    Status post UVPP   Pneumonia    >10 years ago as of 02/12/2023 per pt   Rectocele    grade 1   SOB (shortness of breath)    Stress incontinence    urine   Thyroid  disease    Wears glasses    Wears hearing aid    bilateral   Past Surgical History:  Procedure Laterality Date   ANTERIOR AND POSTERIOR REPAIR N/A 02/22/2023   Procedure: ANTERIOR (CYSTOCELE) AND POSTERIOR REPAIR (RECTOCELE);  Surgeon: Darcel Pool, MD;  Location: [facility];  Service: Gynecology;  Laterality: N/A;   COLONOSCOPY  07/15/2021   ascending colon tubular adenoma   THROAT SURGERY     Uvulopalatopharyngoplasty   TONSILLECTOMY     VAGINAL HYSTERECTOMY N/A 02/22/2023   Procedure: TOTAL HYSTERECTOMY VAGINAL WITH BILATERAL SALPINGECTOMY;  Surgeon: Darcel Pool, MD;  Location: [facility];  Service: Gynecology;  Laterality: N/A;   Patient Active Problem List   Diagnosis Date Noted   Abnormal weight gain 07/16/2023   Bilateral hearing loss 07/16/2023   Disorder of bone and  articular cartilage 07/16/2023   Diverticulosis of colon 07/16/2023   Pelvic relaxation due to vaginal prolapse 02/22/2023   Otalgia of both ears 06/28/2022   Bloating 05/24/2021   Flatulence 05/24/2021   Abnormal renal function 05/05/2021   Increased frequency of urination 11/23/2020   Hypothyroidism 09/28/2020   Mixed hyperlipidemia 09/28/2020   Vitamin D deficiency 09/28/2020   Gas bloat syndrome 08/25/2020   Fecal smearing 08/25/2020   Change in bowel habits 08/25/2020   Sensorineural hearing loss (SNHL) of both ears 07/23/2018   Irritable bowel syndrome with diarrhea 05/30/2018   Low back pain 02/17/2013   Right knee pain 02/17/2013   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   IUD complication 06/24/2011   Graves' disease 06/24/2011   Osteopenia 06/24/2011    PCP: Shona Rush, MD  REFERRING PROVIDER: Gretta Forte, PA  REFERRING DIAG:  Diagnosis  M25.551 (ICD-10-CM) - Pain in right hip  M54.50 (ICD-10-CM) - Lumbar pain    Rationale for Evaluation and Treatment: Rehabilitation  THERAPY DIAG:  Muscle weakness (generalized)  Other low back pain  Difficulty in walking, not elsewhere classified  Cramp and spasm  Pain in right leg  Abnormal posture  ONSET DATE: September 2025  SUBJECTIVE:                                                                                                                                                                                           SUBJECTIVE STATEMENT: Patient reports she is doing better.  Recovering from my trip.      From MD note: [name] returns today status post trigger point injection right sacral region 02/18/2024. She states she got some relief from this. But now pain is more mid sacrum area but she also describes that she is unable to lie on her right hip due to pain lateral aspect of the hip at times. No real radicular symptoms down the leg. Denies any numbness tingling. No groin pain. Difficulty  ambulating due to the pain in the hip.   PERTINENT HISTORY:  Knee pain, stress incontinence  PAIN: 03/21/24 Are you having pain? Yes: NPRS scale: 3/10 Pain location: Rt sacrum, gluteals and lateral leg Pain description: hurting, intense, constant Aggravating factors: movement, standing and walking  Relieving factors: shift to Lt, not moving   PRECAUTIONS: None  RED FLAGS: None   WEIGHT BEARING RESTRICTIONS: No  FALLS:  Has patient fallen in last 6 months? No  LIVING ENVIRONMENT: Lives with: lives with their spouse Lives in: House/apartment Stairs: No Has following equipment at home: None  OCCUPATION: retired   PLOF: Independent, Vocation/Vocational requirements: retired, and Leisure: care fore chestnuts   PATIENT GOALS: reduce pain, care for [employer]  NEXT MD VISIT:   OBJECTIVE:  Note: Objective measures were completed at Evaluation unless otherwise noted.  DIAGNOSTIC FINDINGS:  02/20/24: X-ray:  AP pelvis lateral view of the right hip: Bilateral hips well located.  Hip  joints are well-maintained bilaterally.  Sclerotic changes over the  trochanteric regions left greater than right.  No acute fractures acute  findings.   PATIENT SURVEYS:  02/26/24: LEFS: 20/80=25%  COGNITION: Overall cognitive status: Within functional limits for tasks assessed     SENSATION: WFL  MUSCLE LENGTH: Hamstring length reduced by 25%   POSTURE: rounded shoulders, forward head, and weight shift left  PALPATION: Tension in Rt gluteals and pain at Rt SI joint  LUMBAR ROM:  WFLs without pain  LOWER EXTREMITY ROM:     Bil hip flexibility limited by 25% limitation bil.  Lateral Rt hip pain with end range hip adduction and flexion   LOWER EXTREMITY MMT:    4+/5 bil hips 5/5 knees, 5/5 ankles   LUMBAR SPECIAL TESTS:  Straight leg raise test: Negative  FUNCTIONAL TESTS:  Max UE support with sit to stand due to Rt LE pain  GAIT: Distance  walked: 50 Assistive device  utilized: None Level of assistance: Complete Independence Comments: significant antalgia with reduced time on Rt LE   TREATMENT DATE:  03/21/24:  NuStep: Level 5 x 5 minutes-PT present to discuss progress Standing hamstring stretch at stairs 3 x 30 sec each LE Standing quad/hip flexor stretch 3 x 30 sec Seated piriformis stretch 3 x 30 sec Sit to stand 2 x 10 with 8 lb db vc's to  At counter: lateral band walks with yellow loop x 3 laps (switched to red loop because she wasn't feeling it) Hook lying clam with red loop 2 x 10 Side lying clam 2 x 10 each LE with red loop Supine PPT x 20 Supine PPT with 90/90 heel tap x 20   03/19/24:  NuStep: Level 5 x 5 minutes-PT present to discuss progress Standing hamstring stretch at stairs 3 x 30 sec each LE Standing quad/hip flexor stretch 3 x 30 sec Seated piriformis stretch 3 x 30 sec Sit to stand x 10 with 5 lb kb Seated LAQ with 3 lbs 2 x 10 each LE Seated march with 3 lbs x 20 At counter: lateral band walks with yellow tband Supine PPT x 20 Supine PPT with 90/90 heel tap x 20 Supine PPT with dying bug x 20  03/12/24:  NuStep: Level 5 x 6 minutes-PT present to discuss progress Lengthy amount of time spent on sit to stand re: the exercise of sit to stand without using hands vs home safety and using hands to get up and down from a chair. Completed 2 x 10.  Seated LAQ with 3 lbs 2 x 10 each LE Seated march with 3 lbs x 20 Standing with 3 lb aw in // bars hip abduction and extension 2 x 10 In // bars: lateral band walks with yellow tband Seated clam with yellow tband x 20 Supine hook lying clam with yellow tband x 20 Sidelying clam 2x10 Seated figure 4 stretch 2x20 seconds each LE  03/05/24:  NuStep: Level 4x6 minutes-PT present to discuss progress Answered pt questions regarding activity and how to modify Education regarding purpose of dry needling and how it works- she will consider for next session  Seated figure 4 stretch 3x20  seconds, hamstring stretch 3x20 seconds  Sidelying clam 2x10, reverse clam x10 Sit to stand x10-emphasis on Rt=Lt  Addaday trial to Rt gluteals in Lt sidelying-PT monitored for response and discussed importance of tissue mobility.    02/26/24:  Findings from evaluation discussed, pt educated on plan of care, HEP initiated.                                                                                                                                   PATIENT EDUCATION:  Education details: Access Code: [email or web address], activity modification (03/05/24) Person educated: Patient Education method: Explanation, Demonstration, and Handouts Education comprehension: verbalized understanding and returned demonstration  HOME EXERCISE PROGRAM: Access Code:  [email or web address] URL: [email or web address] Date: 03/19/2024 Prepared by: Delon Haddock  Exercises - Seated Figure 4 Piriformis Stretch  - 3 x daily - 7 x weekly - 3 sets - 10 reps - Seated Hamstring Stretch  - 3 x daily - 7 x weekly - 3 sets - 10 reps - Supine Hip Internal and External Rotation  - 2 x daily - 7 x weekly - 1 sets - 10 reps - 5 hold - Side to Side Weight Shift with Counter Support  - 3 x daily - 7 x weekly - 1-2 sets - 10 reps - Sit to Stand Without Arm Support  - 1-2 x daily - 7 x weekly - 1 sets - 5-10 reps - Clamshell  - 1 x daily - 7 x weekly - 2 sets - 10 reps - Sidelying Reverse Clamshell  - 1 x daily - 7 x weekly - 2 sets - 10 reps - Supine Posterior Pelvic Tilt  - 1 x daily - 7 x weekly - 1 sets - 20 reps - Supine 90/90 Alternating Heel Touches with Posterior Pelvic Tilt  - 1 x daily - 7 x weekly - 1 sets - 20 reps - Supine Dead Bug with Leg Extension  - 1 x daily - 7 x weekly - 1 sets - 20 reps  ASSESSMENT:  CLINICAL IMPRESSION: Shaunie seems to have recovered from her set back.  She needed repeat explanation on several exercises that we have done each visit and some that she is doing at home.  She commonly  responds to exercise that I don't feel anything.  We have added right much resistance and have elevated the stretches and she is beginning to respond with fatigue.  She will need close monitoring.  She is well motivated and compliant.  She should continue to do well.   Patient will benefit from skilled PT to address the below impairments and improve overall function.    OBJECTIVE IMPAIRMENTS: Abnormal gait, decreased activity tolerance, difficulty walking, decreased ROM, increased fascial restrictions, impaired perceived functional ability, increased muscle spasms, improper body mechanics, postural dysfunction, and pain.   ACTIVITY LIMITATIONS: carrying, lifting, standing, squatting, transfers, hygiene/grooming, and locomotion level  PARTICIPATION LIMITATIONS: meal prep, cleaning, shopping, and community activity  PERSONAL FACTORS: Age and 1-2 comorbidities: incontinence, bursitis are also affecting patient's functional outcome.   REHAB POTENTIAL: Good  CLINICAL DECISION MAKING: Stable/uncomplicated  EVALUATION COMPLEXITY: Low   GOALS: Goals reviewed with patient? Yes  SHORT TERM GOALS: Target date: 03/25/2024    Be independent in initial HEP Baseline: Goal status: in progress   2.  Demonstrate symmetry with ambulation on level surface due to reduced pain Baseline: improved symmetry today (03/05/24) Goal status: In progress  3.  Perform sit to stand with Rt=Lt weightbearing and no UE support due to reduced pain Baseline:  Goal status: MET 03/12/24  4.  Report < or = to 6/10 Rt LE pain with standing and walking  Baseline:  Goal status: INITIAL 5. Improve LEFS to > or = to 30/80   Baseline: 20/80  Goal status: INITIAL   LONG TERM GOALS: Target date: 04/22/2024    Be independent in advanced HEP Baseline:  Goal status: INITIAL  2.  Improve LEFS to > or = to 50/80  Baseline: 20/80 Goal status: INITIAL  3.  Report > or = to 70% reduction in Rt LE pain with standing  and walking  Baseline:  Goal status: INITIAL  4.  Return to care of chestnuts without  limitation due to Rt LE pain Baseline:  Goal status: INITIAL  5.  Stand and walk without limitation at home and in the community Baseline:  Goal status: INITIAL  PLAN:  PT FREQUENCY: 2x/week  PT DURATION: 8 weeks  PLANNED INTERVENTIONS: 97110-Therapeutic exercises, 97530- Therapeutic activity, 97112- Neuromuscular re-education, 97535- Self Care, [id number]- Manual therapy, [phone]- Canalith repositioning, [id number]- Aquatic Therapy, [phone]- Electrical stimulation (unattended), [phone]- Electrical stimulation (manual), [id number]- Ionotophoresis 4mg /ml Dexamethasone , [id number] (1-2 muscles), [id number] (3+ muscles)- Dry Needling, Patient/Family education, Balance training, Stair training, Taping, Joint mobilization, Joint manipulation, Spinal manipulation, Spinal mobilization, Vestibular training, Cryotherapy, and Moist heat.  PLAN FOR NEXT SESSION: LE flexibility, core strengthening, hip strengthening and stability training, dry needling to Rt gluteals and low back if pt expresses need , work on gait and weightbearing into Rt LE, NuStep   Juliany Daughety B. Calyb Mcquarrie, PT 03/21/24 11:21 AM [facility] [address], [address] [place], [place] [address] Phone # [phone] Fax [phone]

## 2024-03-24 ENCOUNTER — Ambulatory Visit

## 2024-03-24 DIAGNOSIS — R252 Cramp and spasm: Secondary | ICD-10-CM | POA: Diagnosis not present

## 2024-03-24 DIAGNOSIS — M5459 Other low back pain: Secondary | ICD-10-CM | POA: Diagnosis not present

## 2024-03-24 DIAGNOSIS — R262 Difficulty in walking, not elsewhere classified: Secondary | ICD-10-CM | POA: Diagnosis not present

## 2024-03-24 DIAGNOSIS — M6281 Muscle weakness (generalized): Secondary | ICD-10-CM | POA: Diagnosis not present

## 2024-03-24 DIAGNOSIS — M79604 Pain in right leg: Secondary | ICD-10-CM | POA: Diagnosis not present

## 2024-03-24 DIAGNOSIS — R293 Abnormal posture: Secondary | ICD-10-CM | POA: Diagnosis not present

## 2024-03-24 NOTE — Therapy (Signed)
 OUTPATIENT PHYSICAL THERAPY TREATMENT    Patient Name: Sherry Reed MRN: [id number] DOB:[birth year], 73 y.o., female Today's Date: 03/24/2024  END OF SESSION:  PT End of Session - 03/24/24 1142     Visit Number 8    Date for Recertification  04/22/24    Authorization Type [employer] [id number] vl 02/26/24-05/26/2024    Authorization - Visit Number 8    Authorization - Number of Visits 16    Progress Note Due on Visit 10    PT Start Time 1103    PT Stop Time 1145    PT Time Calculation (min) 42 min    Activity Tolerance Patient tolerated treatment well    Behavior During Therapy WFL for tasks assessed/performed            Past Medical History:  Diagnosis Date   Arthritis    knees and hands   Chronic knee pain    right, follows w/ ortho   Female cystocele    grade 2   Hearing loss    bilateral   Hypothyroidism    On thyroid  replacement   Observed sleep apnea    Status post UVPP   Pneumonia    >10 years ago as of 02/12/2023 per pt   Rectocele    grade 1   SOB (shortness of breath)    Stress incontinence    urine   Thyroid  disease    Wears glasses    Wears hearing aid    bilateral   Past Surgical History:  Procedure Laterality Date   ANTERIOR AND POSTERIOR REPAIR N/A 02/22/2023   Procedure: ANTERIOR (CYSTOCELE) AND POSTERIOR REPAIR (RECTOCELE);  Surgeon: Sherry Pool, MD;  Location: [facility];  Service: Gynecology;  Laterality: N/A;   COLONOSCOPY  07/15/2021   ascending colon tubular adenoma   THROAT SURGERY     Uvulopalatopharyngoplasty   TONSILLECTOMY     VAGINAL HYSTERECTOMY N/A 02/22/2023   Procedure: TOTAL HYSTERECTOMY VAGINAL WITH BILATERAL SALPINGECTOMY;  Surgeon: Sherry Pool, MD;  Location: [facility];  Service: Gynecology;  Laterality: N/A;   Patient Active Problem List   Diagnosis Date Noted   Abnormal weight gain 07/16/2023   Bilateral hearing loss 07/16/2023   Disorder of bone and articular cartilage  07/16/2023   Diverticulosis of colon 07/16/2023   Pelvic relaxation due to vaginal prolapse 02/22/2023   Otalgia of both ears 06/28/2022   Bloating 05/24/2021   Flatulence 05/24/2021   Abnormal renal function 05/05/2021   Increased frequency of urination 11/23/2020   Hypothyroidism 09/28/2020   Mixed hyperlipidemia 09/28/2020   Vitamin D deficiency 09/28/2020   Gas bloat syndrome 08/25/2020   Fecal smearing 08/25/2020   Change in bowel habits 08/25/2020   Sensorineural hearing loss (SNHL) of both ears 07/23/2018   Irritable bowel syndrome with diarrhea 05/30/2018   Low back pain 02/17/2013   Right knee pain 02/17/2013   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   IUD complication 06/24/2011   Graves' disease 06/24/2011   Osteopenia 06/24/2011    PCP: Sherry Rush, MD  REFERRING PROVIDER: Gretta Forte, PA  REFERRING DIAG:  Diagnosis  M25.551 (ICD-10-CM) - Pain in right hip  M54.50 (ICD-10-CM) - Lumbar pain    Rationale for Evaluation and Treatment: Rehabilitation  THERAPY DIAG:  Muscle weakness (generalized)  Other low back pain  Difficulty in walking, not elsewhere classified  Cramp and spasm  ONSET DATE: September 2025  SUBJECTIVE:  SUBJECTIVE STATEMENT: I feel 70% better overall.  My pain is no longer constant.     From MD note: Sherry Reed returns today status post trigger point injection right sacral region 02/18/2024. She states she got some relief from this. But now pain is more mid sacrum area but she also describes that she is unable to lie on her right hip due to pain lateral aspect of the hip at times. No real radicular symptoms down the leg. Denies any numbness tingling. No groin pain. Difficulty ambulating due to the pain in the hip.   PERTINENT HISTORY:   Knee pain, stress incontinence  PAIN: 03/21/24 Are you having pain? Yes: NPRS scale: 3/10 Pain location: Rt sacrum, gluteals and lateral leg Pain description: hurting, intense, constant Aggravating factors: movement, standing and walking  Relieving factors: shift to Lt, not moving   PRECAUTIONS: None  RED FLAGS: None   WEIGHT BEARING RESTRICTIONS: No  FALLS:  Has patient fallen in last 6 months? No  LIVING ENVIRONMENT: Lives with: lives with their spouse Lives in: House/apartment Stairs: No Has following equipment at home: None  OCCUPATION: retired   PLOF: Independent, Vocation/Vocational requirements: retired, and Leisure: care fore chestnuts   PATIENT GOALS: reduce pain, care for [employer]  NEXT MD VISIT:   OBJECTIVE:  Note: Objective measures were completed at Evaluation unless otherwise noted.  DIAGNOSTIC FINDINGS:  02/20/24: X-ray:  AP pelvis lateral view of the right hip: Bilateral hips well located.  Hip  joints are well-maintained bilaterally.  Sclerotic changes over the  trochanteric regions left greater than right.  No acute fractures acute  findings.   PATIENT SURVEYS:  02/26/24: LEFS: 20/80=25%  COGNITION: Overall cognitive status: Within functional limits for tasks assessed     SENSATION: WFL  MUSCLE LENGTH: Hamstring length reduced by 25%   POSTURE: rounded shoulders, forward head, and weight shift left  PALPATION: Tension in Rt gluteals and pain at Rt SI joint  LUMBAR ROM:  WFLs without pain  LOWER EXTREMITY ROM:     Bil hip flexibility limited by 25% limitation bil.  Lateral Rt hip pain with end range hip adduction and flexion   LOWER EXTREMITY MMT:    4+/5 bil hips 5/5 knees, 5/5 ankles   LUMBAR SPECIAL TESTS:  Straight leg raise test: Negative  FUNCTIONAL TESTS:  Max UE support with sit to stand due to Rt LE pain  GAIT: Distance walked: 50 Assistive device utilized: None Level of assistance: Complete  Independence Comments: significant antalgia with reduced time on Rt LE   TREATMENT DATE:  03/24/24:  NuStep: Level 5 x 5 minutes-PT present to discuss progress Standing hamstring stretch at stairs 3 x 30 sec each LE- used power plate Standing quad/hip flexor stretch 3 x 30 sec- used power plate  Seated piriformis stretch 3 x 30 sec Sit to stand 2 x 10 with 8 lb db-good control  At counter: lateral band walks with yellow loop x 3 laps (switched to red loop because she wasn't feeling it) Seated long arc quads: 3# added 2x10 with TA activation Standing marching: 3# added 2x10 bil each   03/21/24:  NuStep: Level 5 x 5 minutes-PT present to discuss progress Standing hamstring stretch at stairs 3 x 30 sec each LE Standing quad/hip flexor stretch 3 x 30 sec Seated piriformis stretch 3 x 30 sec Sit to stand 2 x 10 with 8 lb db vc's to  At counter: lateral band walks with yellow loop x 3 laps (switched to red  loop because she wasn't feeling it) Hook lying clam with red loop 2 x 10 Side lying clam 2 x 10 each LE with red loop Supine PPT x 20 Supine PPT with 90/90 heel tap x 20   03/19/24:  NuStep: Level 5 x 5 minutes-PT present to discuss progress Standing hamstring stretch at stairs 3 x 30 sec each LE Standing quad/hip flexor stretch 3 x 30 sec Seated piriformis stretch 3 x 30 sec Sit to stand x 10 with 5 lb kb Seated LAQ with 3 lbs 2 x 10 each LE Seated march with 3 lbs x 20 At counter: lateral band walks with yellow tband Supine PPT x 20 Supine PPT with 90/90 heel tap x 20 Supine PPT with dying bug x 20   PATIENT EDUCATION:  Education details: Access Code: [email or web address], activity modification (03/05/24) Person educated: Patient Education method: Explanation, Demonstration, and Handouts Education comprehension: verbalized understanding and returned demonstration  HOME EXERCISE PROGRAM: Access Code: [email or web address] URL: [email or web address] Date: 03/19/2024 Prepared by:  Delon Haddock  Exercises - Seated Figure 4 Piriformis Stretch  - 3 x daily - 7 x weekly - 3 sets - 10 reps - Seated Hamstring Stretch  - 3 x daily - 7 x weekly - 3 sets - 10 reps - Supine Hip Internal and External Rotation  - 2 x daily - 7 x weekly - 1 sets - 10 reps - 5 hold - Side to Side Weight Shift with Counter Support  - 3 x daily - 7 x weekly - 1-2 sets - 10 reps - Sit to Stand Without Arm Support  - 1-2 x daily - 7 x weekly - 1 sets - 5-10 reps - Clamshell  - 1 x daily - 7 x weekly - 2 sets - 10 reps - Sidelying Reverse Clamshell  - 1 x daily - 7 x weekly - 2 sets - 10 reps - Supine Posterior Pelvic Tilt  - 1 x daily - 7 x weekly - 1 sets - 20 reps - Supine 90/90 Alternating Heel Touches with Posterior Pelvic Tilt  - 1 x daily - 7 x weekly - 1 sets - 20 reps - Supine Dead Bug with Leg Extension  - 1 x daily - 7 x weekly - 1 sets - 20 reps  ASSESSMENT:  CLINICAL IMPRESSION: Pt reports 70% overall improvement in symptoms since the start of care. She reports 4-5/10 max Rt LE pain with standing and walking and she is weightbearing equally when standing now.  She was able to do more exercises in standing today.  PT monitored throughout session for technique and pain.    Patient will benefit from skilled PT to address the below impairments and improve overall function.    OBJECTIVE IMPAIRMENTS: Abnormal gait, decreased activity tolerance, difficulty walking, decreased ROM, increased fascial restrictions, impaired perceived functional ability, increased muscle spasms, improper body mechanics, postural dysfunction, and pain.   ACTIVITY LIMITATIONS: carrying, lifting, standing, squatting, transfers, hygiene/grooming, and locomotion level  PARTICIPATION LIMITATIONS: meal prep, cleaning, shopping, and community activity  PERSONAL FACTORS: Age and 1-2 comorbidities: incontinence, bursitis are also affecting patient's functional outcome.   REHAB POTENTIAL: Good  CLINICAL DECISION MAKING:  Stable/uncomplicated  EVALUATION COMPLEXITY: Low   GOALS: Goals reviewed with patient? Yes  SHORT TERM GOALS: Target date: 03/25/2024    Be independent in initial HEP Baseline: Goal status: in progress   2.  Demonstrate symmetry with ambulation on level surface due to reduced pain Baseline: improved symmetry today (03/05/24) Goal status:  In progress  3.  Perform sit to stand with Rt=Lt weightbearing and no UE support due to reduced pain Baseline:  Goal status: MET 03/12/24  4.  Report < or = to 6/10 Rt LE pain with standing and walking  Baseline: 4-5/10 (03/24/24) Goal status: In progress   5. Improve LEFS to > or = to 30/80   Baseline: 20/80  Goal status: INITIAL   LONG TERM GOALS: Target date: 04/22/2024    Be independent in advanced HEP Baseline:  Goal status: INITIAL  2.  Improve LEFS to > or = to 50/80  Baseline: 20/80 Goal status: INITIAL  3.  Report > or = to 70% reduction in Rt LE pain with standing and walking  Baseline:  Goal status: INITIAL  4.  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  Stand and walk without limitation at home and in the community Baseline:  Goal status: INITIAL  PLAN:  PT FREQUENCY: 2x/week  PT DURATION: 8 weeks  PLANNED INTERVENTIONS: 97110-Therapeutic exercises, 97530- Therapeutic activity, 97112- Neuromuscular re-education, 97535- Self Care, 02859- Manual therapy, [phone]- Canalith repositioning, [id number]- Aquatic Therapy, [phone]- Electrical stimulation (unattended), [id number]- Electrical stimulation (manual), [id number]- Ionotophoresis 4mg /ml Dexamethasone , 79439 (1-2 muscles), 20561 (3+ muscles)- Dry Needling, Patient/Family education, Balance training, Stair training, Taping, Joint mobilization, Joint manipulation, Spinal manipulation, Spinal mobilization, Vestibular training, Cryotherapy, and Moist heat.  PLAN FOR NEXT SESSION: LE flexibility, core strengthening, hip  strengthening and stability training,  NuStep   Burnard Joy, PT 03/24/24 11:48 AM  [facility] [address], [address] [place], [place] [address] Phone # [phone] Fax [phone]

## 2024-03-26 ENCOUNTER — Ambulatory Visit

## 2024-03-26 DIAGNOSIS — M5459 Other low back pain: Secondary | ICD-10-CM | POA: Diagnosis not present

## 2024-03-26 DIAGNOSIS — R252 Cramp and spasm: Secondary | ICD-10-CM

## 2024-03-26 DIAGNOSIS — M79604 Pain in right leg: Secondary | ICD-10-CM

## 2024-03-26 DIAGNOSIS — R262 Difficulty in walking, not elsewhere classified: Secondary | ICD-10-CM | POA: Diagnosis not present

## 2024-03-26 DIAGNOSIS — M6281 Muscle weakness (generalized): Secondary | ICD-10-CM

## 2024-03-26 DIAGNOSIS — R293 Abnormal posture: Secondary | ICD-10-CM | POA: Diagnosis not present

## 2024-03-26 NOTE — Patient Instructions (Signed)
   Lifting Principles  Maintain proper posture and head alignment. Slide object as close as possible before lifting. Move obstacles out of the way. Test before lifting; ask for help if too heavy. Tighten stomach muscles without holding breath. Use smooth movements; do not jerk. Use legs to do the work, and pivot with feet. Distribute the work load symmetrically and close to the center of trunk. Push instead of pull whenever possible.   Squat down and hold basket close to stand. Use leg muscles to do the work.    Avoid twisting or bending back. Pivot around using foot movements, and bend at knees if needed when reaching for articles.        Getting Into / Out of Bed   Lower self to lie down on one side by raising legs and lowering head at the same time. Use arms to assist moving without twisting. Bend both knees to roll onto back if desired. To sit up, start from lying on side, and use same move-ments in reverse. Keep trunk aligned with legs.    Shift weight from front foot to back foot as item is lifted off shelf.    When leaning forward to pick object up from floor, extend one leg out behind. Keep back straight. Hold onto a sturdy support with other hand.      Sit upright, head facing forward. Try using a roll to support lower back. Keep shoulders relaxed, and avoid rounded back. Keep hips level with knees. Avoid crossing legs for long periods.     [facility] [address], [place], [place] [address] Phone # [phone] Fax [phone]

## 2024-03-26 NOTE — Therapy (Signed)
 OUTPATIENT PHYSICAL THERAPY TREATMENT    Patient Name: Sherry Reed MRN: [id number] DOB:Mar 19, 1951, 73 y.o., female Today's Date: 03/26/2024  END OF SESSION:  PT End of Session - 03/26/24 1016     Visit Number 9    Date for Recertification  04/22/24    Authorization Type [employer] [id number] vl 02/26/24-05/26/2024    Authorization - Visit Number 9    Authorization - Number of Visits 16    Progress Note Due on Visit 10    PT Start Time 0933    PT Stop Time 1015    PT Time Calculation (min) 42 min    Activity Tolerance Patient tolerated treatment well    Behavior During Therapy WFL for tasks assessed/performed             Past Medical History:  Diagnosis Date   Arthritis    knees and hands   Chronic knee pain    right, follows w/ ortho   Female cystocele    grade 2   Hearing loss    bilateral   Hypothyroidism    On thyroid  replacement   Observed sleep apnea    Status post UVPP   Pneumonia    >10 years ago as of 02/12/2023 per pt   Rectocele    grade 1   SOB (shortness of breath)    Stress incontinence    urine   Thyroid  disease    Wears glasses    Wears hearing aid    bilateral   Past Surgical History:  Procedure Laterality Date   ANTERIOR AND POSTERIOR REPAIR N/A 02/22/2023   Procedure: ANTERIOR (CYSTOCELE) AND POSTERIOR REPAIR (RECTOCELE);  Surgeon: Darcel Pool, MD;  Location: [facility];  Service: Gynecology;  Laterality: N/A;   COLONOSCOPY  07/15/2021   ascending colon tubular adenoma   THROAT SURGERY     Uvulopalatopharyngoplasty   TONSILLECTOMY     VAGINAL HYSTERECTOMY N/A 02/22/2023   Procedure: TOTAL HYSTERECTOMY VAGINAL WITH BILATERAL SALPINGECTOMY;  Surgeon: Darcel Pool, MD;  Location: [facility] [place];  Service: Gynecology;  Laterality: N/A;   Patient Active Problem List   Diagnosis Date Noted   Abnormal weight gain 07/16/2023   Bilateral hearing loss 07/16/2023   Disorder of bone and articular cartilage  07/16/2023   Diverticulosis of colon 07/16/2023   Pelvic relaxation due to vaginal prolapse 02/22/2023   Otalgia of both ears 06/28/2022   Bloating 05/24/2021   Flatulence 05/24/2021   Abnormal renal function 05/05/2021   Increased frequency of urination 11/23/2020   Hypothyroidism 09/28/2020   Mixed hyperlipidemia 09/28/2020   Vitamin D deficiency 09/28/2020   Gas bloat syndrome 08/25/2020   Fecal smearing 08/25/2020   Change in bowel habits 08/25/2020   Sensorineural hearing loss (SNHL) of both ears 07/23/2018   Irritable bowel syndrome with diarrhea 05/30/2018   Low back pain 02/17/2013   Right knee pain 02/17/2013   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   IUD complication 06/24/2011   Graves' disease 06/24/2011   Osteopenia 06/24/2011    PCP: Shona Rush, MD  REFERRING PROVIDER: Gretta Forte, PA  REFERRING DIAG:  Diagnosis  M25.551 (ICD-10-CM) - Pain in right hip  M54.50 (ICD-10-CM) - Lumbar pain    Rationale for Evaluation and Treatment: Rehabilitation  THERAPY DIAG:  Muscle weakness (generalized)  Other low back pain  Difficulty in walking, not elsewhere classified  Cramp and spasm  Pain in right leg  ONSET DATE: September 2025  SUBJECTIVE:  SUBJECTIVE STATEMENT: I am so much better.  I bent over to lift something from the floor and felt pain in my back for the rest of the day. I feel better now.     From MD note: [name] returns today status post trigger point injection right sacral region 02/18/2024. She states she got some relief from this. But now pain is more mid sacrum area but she also describes that she is unable to lie on her right hip due to pain lateral aspect of the hip at times. No real radicular symptoms down the leg. Denies any numbness  tingling. No groin pain. Difficulty ambulating due to the pain in the hip.   PERTINENT HISTORY:  Knee pain, stress incontinence  PAIN: 03/21/24 Are you having pain? Yes: NPRS scale: 3/10 Pain location: Rt sacrum, gluteals and lateral leg Pain description: hurting, intense, constant Aggravating factors: movement, standing and walking  Relieving factors: shift to Lt, not moving   PRECAUTIONS: None  RED FLAGS: None   WEIGHT BEARING RESTRICTIONS: No  FALLS:  Has patient fallen in last 6 months? No  LIVING ENVIRONMENT: Lives with: lives with their spouse Lives in: House/apartment Stairs: No Has following equipment at home: None  OCCUPATION: retired   PLOF: Independent, Vocation/Vocational requirements: retired, and Leisure: care fore chestnuts   PATIENT GOALS: reduce pain, care for [employer]  NEXT MD VISIT:   OBJECTIVE:  Note: Objective measures were completed at Evaluation unless otherwise noted.  DIAGNOSTIC FINDINGS:  02/20/24: X-ray:  AP pelvis lateral view of the right hip: Bilateral hips well located.  Hip  joints are well-maintained bilaterally.  Sclerotic changes over the  trochanteric regions left greater than right.  No acute fractures acute  findings.   PATIENT SURVEYS:  02/26/24: LEFS: 20/80=25%  COGNITION: Overall cognitive status: Within functional limits for tasks assessed     SENSATION: WFL  MUSCLE LENGTH: Hamstring length reduced by 25%   POSTURE: rounded shoulders, forward head, and weight shift left  PALPATION: Tension in Rt gluteals and pain at Rt SI joint  LUMBAR ROM:  WFLs without pain  LOWER EXTREMITY ROM:     Bil hip flexibility limited by 25% limitation bil.  Lateral Rt hip pain with end range hip adduction and flexion   LOWER EXTREMITY MMT:    4+/5 bil hips 5/5 knees, 5/5 ankles   LUMBAR SPECIAL TESTS:  Straight leg raise test: Negative  FUNCTIONAL TESTS:  Max UE support with sit to stand due to Rt LE  pain  GAIT: Distance walked: 50 Assistive device utilized: None Level of assistance: Complete Independence Comments: significant antalgia with reduced time on Rt LE   TREATMENT DATE:   03/26/24:  NuStep: Level 5 x 5 minutes-PT present to discuss progress Discussed lifting techniques for lumbar protection  and log roll for in/out of be  Standing hamstring stretch at stairs 2 x 30 sec each LE- used power plate Sit to stand 1 x 10 with 8 lb db-good control, staggered stance Rt and Lt x5 each  Seated long arc quads: 3# added 2x10 with TA activation- pain on Lt so stopped  Standing marching: 3# added 2x10 bil each  Supine: hip IR/ER x5 bil, TA activation with clams 2x10 with green band  03/24/24:  NuStep: Level 5 x 5 minutes-PT present to discuss progress Standing hamstring stretch at stairs 3 x 30 sec each LE- used power plate Standing quad/hip flexor stretch 3 x 30 sec- used power plate  Seated piriformis stretch 3 x  30 sec Sit to stand 2 x 10 with 8 lb db-good control  At counter: lateral band walks with yellow loop x 3 laps (switched to red loop because she wasn't feeling it) Seated long arc quads: 3# added 2x10 with TA activation Standing marching: 3# added 2x10 bil each   03/21/24:  NuStep: Level 5 x 5 minutes-PT present to discuss progress Standing hamstring stretch at stairs 3 x 30 sec each LE Standing quad/hip flexor stretch 3 x 30 sec Seated piriformis stretch 3 x 30 sec Sit to stand 2 x 10 with 8 lb db vc's to  At counter: lateral band walks with yellow loop x 3 laps (switched to red loop because she wasn't feeling it) Hook lying clam with red loop 2 x 10 Side lying clam 2 x 10 each LE with red loop Supine PPT x 20 Supine PPT with 90/90 heel tap x 20    PATIENT EDUCATION:  Education details: Access Code: [email or web address], activity modification (03/05/24), body mechanics (03/26/24) Person educated: Patient Education method: Explanation, Demonstration, and  Handouts Education comprehension: verbalized understanding and returned demonstration  HOME EXERCISE PROGRAM: Access Code: [email or web address] URL: [email or web address] Date: 03/19/2024 Prepared by: Delon Haddock  Exercises - Seated Figure 4 Piriformis Stretch  - 3 x daily - 7 x weekly - 3 sets - 10 reps - Seated Hamstring Stretch  - 3 x daily - 7 x weekly - 3 sets - 10 reps - Supine Hip Internal and External Rotation  - 2 x daily - 7 x weekly - 1 sets - 10 reps - 5 hold - Side to Side Weight Shift with Counter Support  - 3 x daily - 7 x weekly - 1-2 sets - 10 reps - Sit to Stand Without Arm Support  - 1-2 x daily - 7 x weekly - 1 sets - 5-10 reps - Clamshell  - 1 x daily - 7 x weekly - 2 sets - 10 reps - Sidelying Reverse Clamshell  - 1 x daily - 7 x weekly - 2 sets - 10 reps - Supine Posterior Pelvic Tilt  - 1 x daily - 7 x weekly - 1 sets - 20 reps - Supine 90/90 Alternating Heel Touches with Posterior Pelvic Tilt  - 1 x daily - 7 x weekly - 1 sets - 20 reps - Supine Dead Bug with Leg Extension  - 1 x daily - 7 x weekly - 1 sets - 20 reps  ASSESSMENT:  CLINICAL IMPRESSION: Pt continues to report 70% overall improvement in symptoms since the start of care. Pt had some increased central low back pain after lifting something from the floor.  We reviewed lifting techniques paired with abdominal activation. She did well with advanced exercises this week and continues to demonstrate functional strength improvements.  PT monitored throughout session for technique and pain.    Patient will benefit from skilled PT to address the below impairments and improve overall function.    OBJECTIVE IMPAIRMENTS: Abnormal gait, decreased activity tolerance, difficulty walking, decreased ROM, increased fascial restrictions, impaired perceived functional ability, increased muscle spasms, improper body mechanics, postural dysfunction, and pain.   ACTIVITY LIMITATIONS: carrying, lifting, standing,  squatting, transfers, hygiene/grooming, and locomotion level  PARTICIPATION LIMITATIONS: meal prep, cleaning, shopping, and community activity  PERSONAL FACTORS: Age and 1-2 comorbidities: incontinence, bursitis are also affecting patient's functional outcome.   REHAB POTENTIAL: Good  CLINICAL DECISION MAKING: Stable/uncomplicated  EVALUATION COMPLEXITY: Low   GOALS: Goals reviewed with patient? Yes  SHORT TERM  GOALS: Target date: 03/25/2024    Be independent in initial HEP Baseline:03/26/24 Goal status: MET   2.  Demonstrate symmetry with ambulation on level surface due to reduced pain Baseline: improved symmetry today (03/05/24) Goal status: In progress  3.  Perform sit to stand with Rt=Lt weightbearing and no UE support due to reduced pain Baseline:  Goal status: MET 03/12/24  4.  Report < or = to 6/10 Rt LE pain with standing and walking  Baseline: 4-5/10 (03/24/24) Goal status: In progress   5. Improve LEFS to > or = to 30/80   Baseline: 20/80  Goal status: INITIAL   LONG TERM GOALS: Target date: 04/22/2024    Be independent in advanced HEP Baseline:  Goal status: INITIAL  2.  Improve LEFS to > or = to 50/80  Baseline: 20/80 Goal status: INITIAL  3.  Report > or = to 70% reduction in Rt LE pain with standing and walking  Baseline:  Goal status: INITIAL  4.  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  Stand and walk without limitation at home and in the community Baseline:  Goal status: INITIAL  PLAN:  PT FREQUENCY: 2x/week  PT DURATION: 8 weeks  PLANNED INTERVENTIONS: 97110-Therapeutic exercises, 97530- Therapeutic activity, 97112- Neuromuscular re-education, 97535- Self Care, [id number]- Manual therapy, [phone]- Canalith repositioning, [id number]- Aquatic Therapy, [phone]- Electrical stimulation (unattended), [phone]- Electrical stimulation (manual), [id number]- Ionotophoresis 4mg /ml Dexamethasone , [id number]  (1-2 muscles), [id number] (3+ muscles)- Dry Needling, Patient/Family education, Balance training, Stair training, Taping, Joint mobilization, Joint manipulation, Spinal manipulation, Spinal mobilization, Vestibular training, Cryotherapy, and Moist heat.  PLAN FOR NEXT SESSION: LE flexibility, core strengthening, hip strengthening and stability training,  NuStep   Burnard Joy, PT 03/26/24 10:17 AM  [facility] [address], [address] [place], [place] [address] Phone # [phone] Fax [phone]

## 2024-03-31 ENCOUNTER — Ambulatory Visit

## 2024-03-31 DIAGNOSIS — M6281 Muscle weakness (generalized): Secondary | ICD-10-CM | POA: Diagnosis not present

## 2024-03-31 DIAGNOSIS — M5459 Other low back pain: Secondary | ICD-10-CM | POA: Diagnosis not present

## 2024-03-31 DIAGNOSIS — M79604 Pain in right leg: Secondary | ICD-10-CM | POA: Diagnosis not present

## 2024-03-31 DIAGNOSIS — R293 Abnormal posture: Secondary | ICD-10-CM | POA: Diagnosis not present

## 2024-03-31 DIAGNOSIS — R252 Cramp and spasm: Secondary | ICD-10-CM

## 2024-03-31 DIAGNOSIS — R262 Difficulty in walking, not elsewhere classified: Secondary | ICD-10-CM

## 2024-03-31 NOTE — Therapy (Signed)
 OUTPATIENT PHYSICAL THERAPY TREATMENT    Patient Name: Sherry Reed MRN: [id number] DOB:1951-01-01, 73 y.o., female Today's Date: 03/31/2024 Progress Note Reporting Period 02/26/24 to 03/31/24  See note below for Objective Data and Assessment of Progress/Goals.     END OF SESSION:  PT End of Session - 03/31/24 1058     Visit Number 10    Date for Recertification  04/22/24    Authorization Type [employer] [id number] vl 02/26/24-05/26/2024    Authorization - Visit Number 10    Authorization - Number of Visits 16    Progress Note Due on Visit 10    PT Start Time 1016    PT Stop Time 1058    PT Time Calculation (min) 42 min    Activity Tolerance Patient tolerated treatment well    Behavior During Therapy WFL for tasks assessed/performed              Past Medical History:  Diagnosis Date   Arthritis    knees and hands   Chronic knee pain    right, follows w/ ortho   Female cystocele    grade 2   Hearing loss    bilateral   Hypothyroidism    On thyroid  replacement   Observed sleep apnea    Status post UVPP   Pneumonia    >10 years ago as of 02/12/2023 per pt   Rectocele    grade 1   SOB (shortness of breath)    Stress incontinence    urine   Thyroid  disease    Wears glasses    Wears hearing aid    bilateral   Past Surgical History:  Procedure Laterality Date   ANTERIOR AND POSTERIOR REPAIR N/A 02/22/2023   Procedure: ANTERIOR (CYSTOCELE) AND POSTERIOR REPAIR (RECTOCELE);  Surgeon: Darcel Pool, MD;  Location: [facility];  Service: Gynecology;  Laterality: N/A;   COLONOSCOPY  07/15/2021   ascending colon tubular adenoma   THROAT SURGERY     Uvulopalatopharyngoplasty   TONSILLECTOMY     VAGINAL HYSTERECTOMY N/A 02/22/2023   Procedure: TOTAL HYSTERECTOMY VAGINAL WITH BILATERAL SALPINGECTOMY;  Surgeon: Darcel Pool, MD;  Location: [facility];  Service: Gynecology;  Laterality: N/A;   Patient Active Problem List   Diagnosis  Date Noted   Abnormal weight gain 07/16/2023   Bilateral hearing loss 07/16/2023   Disorder of bone and articular cartilage 07/16/2023   Diverticulosis of colon 07/16/2023   Pelvic relaxation due to vaginal prolapse 02/22/2023   Otalgia of both ears 06/28/2022   Bloating 05/24/2021   Flatulence 05/24/2021   Abnormal renal function 05/05/2021   Increased frequency of urination 11/23/2020   Hypothyroidism 09/28/2020   Mixed hyperlipidemia 09/28/2020   Vitamin D deficiency 09/28/2020   Gas bloat syndrome 08/25/2020   Fecal smearing 08/25/2020   Change in bowel habits 08/25/2020   Sensorineural hearing loss (SNHL) of both ears 07/23/2018   Irritable bowel syndrome with diarrhea 05/30/2018   Low back pain 02/17/2013   Right knee pain 02/17/2013   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   IUD complication 06/24/2011   Graves' disease 06/24/2011   Osteopenia 06/24/2011    PCP: Shona Rush, MD  REFERRING PROVIDER: Gretta Forte, PA  REFERRING DIAG:  Diagnosis  M25.551 (ICD-10-CM) - Pain in right hip  M54.50 (ICD-10-CM) - Lumbar pain    Rationale for Evaluation and Treatment: Rehabilitation  THERAPY DIAG:  Muscle weakness (generalized)  Other low back pain  Difficulty in walking, not elsewhere classified  Cramp and spasm  Pain in right leg  ONSET DATE: September 2025  SUBJECTIVE:                                                                                                                                                                                           SUBJECTIVE STATEMENT: My Rt leg is feeling better and I had LBP after lifting when planting flowers.     From MD note: [name] returns today status post trigger point injection right sacral region 02/18/2024. She states she got some relief from this. But now pain is more mid sacrum area but she also describes that she is unable to lie on her right hip due to pain lateral aspect of the hip at  times. No real radicular symptoms down the leg. Denies any numbness tingling. No groin pain. Difficulty ambulating due to the pain in the hip.   PERTINENT HISTORY:  Knee pain, stress incontinence  PAIN: 03/31/24 Are you having pain? Yes: NPRS scale: 3/10 Pain location: Rt sacrum, gluteals and lateral leg Pain description: hurting, intense, constant Aggravating factors: movement, standing and walking  Relieving factors: shift to Lt, not moving   PRECAUTIONS: None  RED FLAGS: None   WEIGHT BEARING RESTRICTIONS: No  FALLS:  Has patient fallen in last 6 months? No  LIVING ENVIRONMENT: Lives with: lives with their spouse Lives in: House/apartment Stairs: No Has following equipment at home: None  OCCUPATION: retired   PLOF: Independent, Vocation/Vocational requirements: retired, and Leisure: care fore chestnuts   PATIENT GOALS: reduce pain, care for [employer]  NEXT MD VISIT:   OBJECTIVE:  Note: Objective measures were completed at Evaluation unless otherwise noted.  DIAGNOSTIC FINDINGS:  02/20/24: X-ray:  AP pelvis lateral view of the right hip: Bilateral hips well located.  Hip  joints are well-maintained bilaterally.  Sclerotic changes over the  trochanteric regions left greater than right.  No acute fractures acute  findings.   PATIENT SURVEYS:  02/26/24: LEFS: 20/80=25% 03/31/24: 28/80  COGNITION: Overall cognitive status: Within functional limits for tasks assessed     SENSATION: WFL  MUSCLE LENGTH: Hamstring length reduced by 25%   POSTURE: rounded shoulders, forward head, and weight shift left  PALPATION: Tension in Rt gluteals and pain at Rt SI joint  LUMBAR ROM:  WFLs without pain  LOWER EXTREMITY ROM:     Bil hip flexibility limited by 25% limitation bil.  Lateral Rt hip pain with end range hip adduction and flexion   LOWER EXTREMITY MMT:    4+/5 bil hips 5/5 knees, 5/5 ankles   LUMBAR SPECIAL TESTS:  Straight leg raise test:  Negative  FUNCTIONAL TESTS:  Max UE support with sit to stand due to Rt LE pain  GAIT: Distance walked: 50 Assistive device utilized: None Level of assistance: Complete Independence Comments: significant antalgia with reduced time on Rt LE   TREATMENT DATE:   03/31/24:  NuStep: Level 5 x 5 minutes-PT present to discuss progress Standing hamstring stretch at stairs 2 x 30 sec each LE- used power plate Sit to stand 1 x 10 with 8 lb db-good control, staggered stance Rt and Lt x5 each  Seated long arc quads: 3# added 2x10 with TA activation-no weight on Lt due to knee pain with this. Seated marching: 3# on Rt, 2# on Lt  2x10 bil each  Standing on balance pad: bil hip abduction and extension 2x10  Sidestepping with blue loop around thighs x3 laps along baree Alternating step taps on 6 step with 5# KB farmer's carry x20 each  Seated piriformis stretch 2x20 seconds bil   03/26/24:  NuStep: Level 5 x 5 minutes-PT present to discuss progress Discussed lifting techniques for lumbar protection  and log roll for in/out of be  Standing hamstring stretch at stairs 2 x 30 sec each LE- used power plate Sit to stand 1 x 10 with 8 lb db-good control, staggered stance Rt and Lt x5 each  Seated long arc quads: 3# added 2x10 with TA activation- pain on Lt so stopped  Standing marching: 3# added 2x10 bil each  Supine: hip IR/ER x5 bil, TA activation with clams 2x10 with green band  03/24/24:  NuStep: Level 5 x 5 minutes-PT present to discuss progress Standing hamstring stretch at stairs 3 x 30 sec each LE- used power plate Standing quad/hip flexor stretch 3 x 30 sec- used power plate  Seated piriformis stretch 3 x 30 sec Sit to stand 2 x 10 with 8 lb db-good control  At counter: lateral band walks with yellow loop x 3 laps (switched to red loop because she wasn't feeling it) Seated long arc quads: 3# added 2x10 with TA activation Standing marching: 3# added 2x10 bil each   03/21/24:  NuStep:  Level 5 x 5 minutes-PT present to discuss progress Standing hamstring stretch at stairs 3 x 30 sec each LE Standing quad/hip flexor stretch 3 x 30 sec Seated piriformis stretch 3 x 30 sec Sit to stand 2 x 10 with 8 lb db vc's to  At counter: lateral band walks with yellow loop x 3 laps (switched to red loop because she wasn't feeling it) Hook lying clam with red loop 2 x 10 Side lying clam 2 x 10 each LE with red loop Supine PPT x 20 Supine PPT with 90/90 heel tap x 20    PATIENT EDUCATION:  Education details: Access Code: [email or web address], activity modification (03/05/24), body mechanics (03/26/24) Person educated: Patient Education method: Explanation, Demonstration, and Handouts Education comprehension: verbalized understanding and returned demonstration  HOME EXERCISE PROGRAM: Access Code: [email or web address] URL: [email or web address] Date: 03/19/2024 Prepared by: Delon Haddock  Exercises - Seated Figure 4 Piriformis Stretch  - 3 x daily - 7 x weekly - 3 sets - 10 reps - Seated Hamstring Stretch  - 3 x daily - 7 x weekly - 3 sets - 10 reps - Supine Hip Internal and External Rotation  - 2 x daily - 7 x weekly - 1 sets - 10 reps - 5 hold - Side to Side Weight Shift with Counter Support  - 3 x daily - 7 x weekly - 1-2 sets -  10 reps - Sit to Stand Without Arm Support  - 1-2 x daily - 7 x weekly - 1 sets - 5-10 reps - Clamshell  - 1 x daily - 7 x weekly - 2 sets - 10 reps - Sidelying Reverse Clamshell  - 1 x daily - 7 x weekly - 2 sets - 10 reps - Supine Posterior Pelvic Tilt  - 1 x daily - 7 x weekly - 1 sets - 20 reps - Supine 90/90 Alternating Heel Touches with Posterior Pelvic Tilt  - 1 x daily - 7 x weekly - 1 sets - 20 reps - Supine Dead Bug with Leg Extension  - 1 x daily - 7 x weekly - 1 sets - 20 reps  ASSESSMENT:  CLINICAL IMPRESSION: Pt continues to report 70% overall improvement in symptoms since the start of care.  She has had 2 episodes of increased LBP over the  past week with lifting.  Pain resolved with stretching and rest.  LEFS is improved to 28/80 today, progress toward goal. She stands with Rt=Lt weightbearing for all standing tasks.   PT monitored throughout session for technique and pain.    Patient will benefit from skilled PT to address the below impairments and improve overall function.    OBJECTIVE IMPAIRMENTS: Abnormal gait, decreased activity tolerance, difficulty walking, decreased ROM, increased fascial restrictions, impaired perceived functional ability, increased muscle spasms, improper body mechanics, postural dysfunction, and pain.   ACTIVITY LIMITATIONS: carrying, lifting, standing, squatting, transfers, hygiene/grooming, and locomotion level  PARTICIPATION LIMITATIONS: meal prep, cleaning, shopping, and community activity  PERSONAL FACTORS: Age and 1-2 comorbidities: incontinence, bursitis are also affecting patient's functional outcome.   REHAB POTENTIAL: Good  CLINICAL DECISION MAKING: Stable/uncomplicated  EVALUATION COMPLEXITY: Low   GOALS: Goals reviewed with patient? Yes  SHORT TERM GOALS: Target date: 03/25/2024    Be independent in initial HEP Baseline:03/26/24 Goal status: MET   2.  Demonstrate symmetry with ambulation on level surface due to reduced pain Baseline: improved symmetry today (03/05/24) Goal status: In progress  3.  Perform sit to stand with Rt=Lt weightbearing and no UE support due to reduced pain Baseline:  Goal status: MET 03/12/24  4.  Report < or = to 6/10 Rt LE pain with standing and walking  Baseline: 4-5/10 (03/24/24) Goal status: In progress   5. Improve LEFS to > or = to 30/80   Baseline: 28/80 (03/31/24)  Goal status: in progress    LONG TERM GOALS: Target date: 04/22/2024    Be independent in advanced HEP Baseline:  Goal status: INITIAL  2.  Improve LEFS to > or = to 50/80  Baseline: 28/80 (03/31/24) Goal status: INITIAL  3.  Report > or = to 70% reduction in Rt LE  pain with standing and walking  Baseline:  Goal status: INITIAL  4.  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  Stand and walk without limitation at home and in the community Baseline:  Goal status: INITIAL  PLAN:  PT FREQUENCY: 2x/week  PT DURATION: 8 weeks  PLANNED INTERVENTIONS: 97110-Therapeutic exercises, 97530- Therapeutic activity, 97112- Neuromuscular re-education, 97535- Self Care, [id number]- Manual therapy, [phone]- Canalith repositioning, [id number]- Aquatic Therapy, [phone]- Electrical stimulation (unattended), [phone]- Electrical stimulation (manual), [id number]- Ionotophoresis 4mg /ml Dexamethasone , 79439 (1-2 muscles), [id number] (3+ muscles)- Dry Needling, Patient/Family education, Balance training, Stair training, Taping, Joint mobilization, Joint manipulation, Spinal manipulation, Spinal mobilization, Vestibular training, Cryotherapy, and Moist heat.  PLAN  FOR NEXT SESSION: LE flexibility, core strengthening, hip strengthening and stability training,  NuStep   Burnard Joy, PT 03/31/24 11:00 AM  [facility] [address] [place], [place] [address] Phone # [phone] Fax [phone]

## 2024-04-02 ENCOUNTER — Ambulatory Visit: Admitting: Physical Therapy

## 2024-04-02 ENCOUNTER — Ambulatory Visit

## 2024-04-02 DIAGNOSIS — M79604 Pain in right leg: Secondary | ICD-10-CM

## 2024-04-02 DIAGNOSIS — M6281 Muscle weakness (generalized): Secondary | ICD-10-CM

## 2024-04-02 DIAGNOSIS — R262 Difficulty in walking, not elsewhere classified: Secondary | ICD-10-CM

## 2024-04-02 DIAGNOSIS — R252 Cramp and spasm: Secondary | ICD-10-CM | POA: Diagnosis not present

## 2024-04-02 DIAGNOSIS — M5459 Other low back pain: Secondary | ICD-10-CM | POA: Diagnosis not present

## 2024-04-02 DIAGNOSIS — R293 Abnormal posture: Secondary | ICD-10-CM | POA: Diagnosis not present

## 2024-04-02 DIAGNOSIS — R279 Unspecified lack of coordination: Secondary | ICD-10-CM | POA: Diagnosis not present

## 2024-04-02 NOTE — Patient Instructions (Signed)
 Bowel Control and Holding On When bowel control is decreased or lost it is helpful to understand some control techniques. Here are some tips for controlling your bowel movements.  Choose the best time of day to have a bowel movement:  Usually the best time of day for a bowel movement will be a half hour to an hour after breakfast.  For some people, a half hour to an hour after lunch will work better.  These times are best because the body uses the gastrocolic reflex, a stimulation of bowel motion that occurs with eating, to help produce a bowel movement.  Make sure that you are not rushed and have convenient access to a bathroom at your selected time.  Eat all your meals at a predictable time each day.  The bowel functions best when food is introduced at the same regular intervals.  The amount of food eaten at a given time of day should be about the same size from day to day.  The bowel functions best when food is introduced in similar quantities from day to day.  It is fine to have a small breakfast and a large lunch, or vice versa, just be consistent.  Eat two servings of fruit or vegetables and at least one serving of complex carbohydrates (whole grains such as brown rice, bran, whole wheat bread, or oatmeal) at each meal.   A serving of fruit or vegetables is a half-cup or medium-sized piece of fruit.  A serving of a complex carbohydrate is a half-cup or a slice of bread.  It is often desirable to eat more than the recommended minimum amounts of fruits, vegetables, and complex carbohydrates.  Drink plenty of water --ideally eight glasses a day.  Until regular bowel movements are established at a desired time of day, take 2-3 dried prunes (or  to 1/3 cup of prune juice) each night to stimulate morning bowel function.  Exercise daily.  You may exercise at any time of day, but you may find that bowel function is helped most if the exercise is at a consistent time each day.   Bladder  Irritants  Certain foods and beverages can be irritating to the bladder.  Avoiding these irritants may decrease your symptoms of urinary urgency, frequency or bladder pain.  Even reducing your intake can help with your symptoms.  Not everyone is sensitive to all bladder irritants, so you may consider focusing on one irritant at a time, removing or reducing your intake of that irritant for 7-10 days to see if this change helps your symptoms.  Water  intake is also very important.  Below is a list of bladder irritants.  Drinks: alcohol, carbonated beverages, caffeinated beverages such as coffee and tea, drinks with artificial sweeteners, citrus juices, apple juice, tomato juice  Foods: tomatoes and tomato based foods, spicy food, sugar and artificial sweeteners, vinegar, chocolate, raw onion, apples, citrus fruits, pineapple, cranberries, tomatoes, strawberries, plums, peaches, cantaloupe  Other: acidic urine (too concentrated) - see water  intake info below  Substitutes you can try that are NOT irritating to the bladder: cooked onion, pears, papayas, sun-brewed decaf teas, watermelons, non-citrus herbal teas, apricots, kava and low-acid instant drinks (Postum).    WATER  INTAKE: Remember to drink lots of water  (aim for fluid intake of half your body weight with 2/3 of fluids being water ).  You may be limiting fluids due to fear of leakage, but this can actually worsen urgency symptoms due to highly concentrated urine.  Water  helps balance the pH  of your urine so it doesn't become too acidic - acidic urine is a bladder irritant!   Bowel irritants:   Fats & Brien Foods: Fried items, heavy creams, gravies, and fast foods. Caffeine: Coffee, tea, sodas, energy drinks. Alcohol: Beer (due to carbonation), wine, spirits. Carbonated Drinks: Sodas, sparkling water . Dairy: Milk, cheese, ice cream (especially if lactose intolerant). Gluten: Wheat, barley, rye, found in many breads, cereals, pasta. High  Fructose/Sugar: High-fructose corn syrup, honey, some fruits (apples, pears, dried fruits). Artificial Sweeteners: Sorbitol, mannitol, xylitol (in sugar-free items). Certain Vegetables (High FODMAP): Garlic, onions, beans, cabbage, Brussels sprouts, broccoli. Spicy Foods: Chili peppers, hot sauces, cayenne pepper. Processed Foods: Chips, processed meats, sugary cereals, pre-made meals. Fiber (Excess/Specific Types): Too much fiber, especially from skins, can worsen symptoms for some.

## 2024-04-02 NOTE — Therapy (Signed)
 OUTPATIENT PHYSICAL THERAPY TREATMENT    Patient Name: Sherry Reed MRN: [id number] DOB:01/24/1951, 73 y.o., female Today's Date: 04/02/2024   END OF SESSION:  PT End of Session - 04/02/24 1104     Visit Number 12    Authorization Type Pelvic auth :[employer] Approved 8 visits-02/14/24-05/14/24-auth#[id number]    Authorization - Visit Number 4    Authorization - Number of Visits 8    Progress Note Due on Visit 10    PT Start Time 1017    PT Stop Time 1059    PT Time Calculation (min) 42 min    Activity Tolerance Patient tolerated treatment well    Behavior During Therapy WFL for tasks assessed/performed              Past Medical History:  Diagnosis Date   Arthritis    knees and hands   Chronic knee pain    right, follows w/ ortho   Female cystocele    grade 2   Hearing loss    bilateral   Hypothyroidism    On thyroid  replacement   Observed sleep apnea    Status post UVPP   Pneumonia    >10 years ago as of 02/12/2023 per pt   Rectocele    grade 1   SOB (shortness of breath)    Stress incontinence    urine   Thyroid  disease    Wears glasses    Wears hearing aid    bilateral   Past Surgical History:  Procedure Laterality Date   ANTERIOR AND POSTERIOR REPAIR N/A 02/22/2023   Procedure: ANTERIOR (CYSTOCELE) AND POSTERIOR REPAIR (RECTOCELE);  Surgeon: Sherry Pool, MD;  Location: [facility];  Service: Gynecology;  Laterality: N/A;   COLONOSCOPY  07/15/2021   ascending colon tubular adenoma   THROAT SURGERY     Uvulopalatopharyngoplasty   TONSILLECTOMY     VAGINAL HYSTERECTOMY N/A 02/22/2023   Procedure: TOTAL HYSTERECTOMY VAGINAL WITH BILATERAL SALPINGECTOMY;  Surgeon: Sherry Pool, MD;  Location: [facility];  Service: Gynecology;  Laterality: N/A;   Patient Active Problem List   Diagnosis Date Noted   Abnormal weight gain 07/16/2023   Bilateral hearing loss 07/16/2023   Disorder of bone and articular cartilage  07/16/2023   Diverticulosis of colon 07/16/2023   Pelvic relaxation due to vaginal prolapse 02/22/2023   Otalgia of both ears 06/28/2022   Bloating 05/24/2021   Flatulence 05/24/2021   Abnormal renal function 05/05/2021   Increased frequency of urination 11/23/2020   Hypothyroidism 09/28/2020   Mixed hyperlipidemia 09/28/2020   Vitamin D deficiency 09/28/2020   Gas bloat syndrome 08/25/2020   Fecal smearing 08/25/2020   Change in bowel habits 08/25/2020   Sensorineural hearing loss (SNHL) of both ears 07/23/2018   Irritable bowel syndrome with diarrhea 05/30/2018   Low back pain 02/17/2013   Right knee pain 02/17/2013   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   IUD complication 06/24/2011   Graves' disease 06/24/2011   Osteopenia 06/24/2011    PCP: Sherry Rush, MD  REFERRING PROVIDER: Gretta Forte, PA  REFERRING DIAG:  Diagnosis  M25.551 (ICD-10-CM) - Pain in right hip  M54.50 (ICD-10-CM) - Lumbar pain    Rationale for Evaluation and Treatment: Rehabilitation  THERAPY DIAG:  Muscle weakness (generalized)  Other low back pain  Difficulty in walking, not elsewhere classified  Cramp and spasm  Pain in right leg  ONSET DATE: September 2025  SUBJECTIVE:  SUBJECTIVE STATEMENT: My Rt leg is feeling better and I had LBP after lifting when planting flowers.     From MD note: [name] returns today status post trigger point injection right sacral region 02/18/2024. She states she got some relief from this. But now pain is more mid sacrum area but she also describes that she is unable to lie on her right hip due to pain lateral aspect of the hip at times. No real radicular symptoms down the leg. Denies any numbness tingling. No groin pain. Difficulty ambulating due to the pain  in the hip.   PERTINENT HISTORY:  Knee pain, stress incontinence  PAIN: 03/31/24 Are you having pain? Yes: NPRS scale: 3/10 Pain location: Rt sacrum, gluteals and lateral leg Pain description: hurting, intense, constant Aggravating factors: movement, standing and walking  Relieving factors: shift to Lt, not moving   PRECAUTIONS: None  RED FLAGS: None   WEIGHT BEARING RESTRICTIONS: No  FALLS:  Has patient fallen in last 6 months? No  LIVING ENVIRONMENT: Lives with: lives with their spouse Lives in: House/apartment Stairs: No Has following equipment at home: None  OCCUPATION: retired   PLOF: Independent, Vocation/Vocational requirements: retired, and Leisure: care fore chestnuts   PATIENT GOALS: reduce pain, care for [employer]  NEXT MD VISIT:   OBJECTIVE:  Note: Objective measures were completed at Evaluation unless otherwise noted.  DIAGNOSTIC FINDINGS:  02/20/24: X-ray:  AP pelvis lateral view of the right hip: Bilateral hips well located.  Hip  joints are well-maintained bilaterally.  Sclerotic changes over the  trochanteric regions left greater than right.  No acute fractures acute  findings.   PATIENT SURVEYS:  02/26/24: LEFS: 20/80=25% 03/31/24: 28/80  COGNITION: Overall cognitive status: Within functional limits for tasks assessed     SENSATION: WFL  MUSCLE LENGTH: Hamstring length reduced by 25%   POSTURE: rounded shoulders, forward head, and weight shift left  PALPATION: Tension in Rt gluteals and pain at Rt SI joint  LUMBAR ROM:  WFLs without pain  LOWER EXTREMITY ROM:     Bil hip flexibility limited by 25% limitation bil.  Lateral Rt hip pain with end range hip adduction and flexion   LOWER EXTREMITY MMT:    4+/5 bil hips 5/5 knees, 5/5 ankles   LUMBAR SPECIAL TESTS:  Straight leg raise test: Negative  FUNCTIONAL TESTS:  Max UE support with sit to stand due to Rt LE pain  GAIT: Distance walked: 50 Assistive device utilized:  None Level of assistance: Complete Independence Comments: significant antalgia with reduced time on Rt LE   TREATMENT DATE:   04/02/24:  NuStep: Level 5 x 5 minutes-PT present to discuss progress Standing hamstring stretch at stairs 2 x 30 sec each LE- used power plate Sit to stand 1 x 10 with 8 lb db-good control, staggered stance Rt and Lt x10 each  Seated long arc quads: 3# added 2x10 with TA activation-no weight on Lt due to knee pain with this. Standing marching on balance pad: 3# on Rt, 2# on Lt  2x10 bil each  Standing on balance pad: bil hip abduction and extension 2x10  Alternating step taps on 6 step with 5# KB farmer's carry x20 each  Seated piriformis stretch 2x20 seconds bil   03/31/24:  NuStep: Level 5 x 5 minutes-PT present to discuss progress Standing hamstring stretch at stairs 2 x 30 sec each LE- used power plate Sit to stand 1 x 10 with 8 lb db-good control, staggered stance Rt and Lt  x5 each  Seated long arc quads: 3# added 2x10 with TA activation-no weight on Lt due to knee pain with this. Seated marching: 3# on Rt, 2# on Lt  2x10 bil each  Standing on balance pad: bil hip abduction and extension 2x10  Sidestepping with blue loop around thighs x3 laps along baree Alternating step taps on 6 step with 5# KB farmer's carry x20 each  Seated piriformis stretch 2x20 seconds bil   03/26/24:  NuStep: Level 5 x 5 minutes-PT present to discuss progress Discussed lifting techniques for lumbar protection  and log roll for in/out of be  Standing hamstring stretch at stairs 2 x 30 sec each LE- used power plate Sit to stand 1 x 10 with 8 lb db-good control, staggered stance Rt and Lt x5 each  Seated long arc quads: 3# added 2x10 with TA activation- pain on Lt so stopped  Standing marching: 3# added 2x10 bil each  Supine: hip IR/ER x5 bil, TA activation with clams 2x10 with green band    PATIENT EDUCATION:  Education details: Access Code: [email or web address], activity modification  (03/05/24), body mechanics (03/26/24) Person educated: Patient Education method: Explanation, Demonstration, and Handouts Education comprehension: verbalized understanding and returned demonstration  HOME EXERCISE PROGRAM: Access Code: [email or web address] URL: [email or web address] Date: 03/19/2024 Prepared by: Delon Haddock  Exercises - Seated Figure 4 Piriformis Stretch  - 3 x daily - 7 x weekly - 3 sets - 10 reps - Seated Hamstring Stretch  - 3 x daily - 7 x weekly - 3 sets - 10 reps - Supine Hip Internal and External Rotation  - 2 x daily - 7 x weekly - 1 sets - 10 reps - 5 hold - Side to Side Weight Shift with Counter Support  - 3 x daily - 7 x weekly - 1-2 sets - 10 reps - Sit to Stand Without Arm Support  - 1-2 x daily - 7 x weekly - 1 sets - 5-10 reps - Clamshell  - 1 x daily - 7 x weekly - 2 sets - 10 reps - Sidelying Reverse Clamshell  - 1 x daily - 7 x weekly - 2 sets - 10 reps - Supine Posterior Pelvic Tilt  - 1 x daily - 7 x weekly - 1 sets - 20 reps - Supine 90/90 Alternating Heel Touches with Posterior Pelvic Tilt  - 1 x daily - 7 x weekly - 1 sets - 20 reps - Supine Dead Bug with Leg Extension  - 1 x daily - 7 x weekly - 1 sets - 20 reps  ASSESSMENT:  CLINICAL IMPRESSION: Pt continues to report 70% overall improvement in symptoms since the start of care.  Today her back is feeling better overall.  She did well with all tasks today. She stands with Rt=Lt weightbearing for all standing tasks.   PT monitored throughout session for technique and pain.    Patient will benefit from skilled PT to address the below impairments and improve overall function.    OBJECTIVE IMPAIRMENTS: Abnormal gait, decreased activity tolerance, difficulty walking, decreased ROM, increased fascial restrictions, impaired perceived functional ability, increased muscle spasms, improper body mechanics, postural dysfunction, and pain.   ACTIVITY LIMITATIONS: carrying, lifting, standing, squatting,  transfers, hygiene/grooming, and locomotion level  PARTICIPATION LIMITATIONS: meal prep, cleaning, shopping, and community activity  PERSONAL FACTORS: Age and 1-2 comorbidities: incontinence, bursitis are also affecting patient's functional outcome.   REHAB POTENTIAL: Good  CLINICAL DECISION MAKING: Stable/uncomplicated  EVALUATION COMPLEXITY: Low   GOALS: Goals  reviewed with patient? Yes  SHORT TERM GOALS: Target date: 03/25/2024    Be independent in initial HEP Baseline:03/26/24 Goal status: MET   2.  Demonstrate symmetry with ambulation on level surface due to reduced pain Baseline: improved symmetry today (03/05/24) Goal status: In progress  3.  Perform sit to stand with Rt=Lt weightbearing and no UE support due to reduced pain Baseline:  Goal status: MET 03/12/24  4.  Report < or = to 6/10 Rt LE pain with standing and walking  Baseline: 4-5/10 (03/24/24) Goal status: In progress   5. Improve LEFS to > or = to 30/80   Baseline: 28/80 (03/31/24)  Goal status: in progress    LONG TERM GOALS: Target date: 04/22/2024    Be independent in advanced HEP Baseline:  Goal status: INITIAL  2.  Improve LEFS to > or = to 50/80  Baseline: 28/80 (03/31/24) Goal status: INITIAL  3.  Report > or = to 70% reduction in Rt LE pain with standing and walking  Baseline:  Goal status: INITIAL  4.  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  Stand and walk without limitation at home and in the community Baseline:  Goal status: INITIAL  PLAN:  PT FREQUENCY: 2x/week  PT DURATION: 8 weeks  PLANNED INTERVENTIONS: 97110-Therapeutic exercises, 97530- Therapeutic activity, 97112- Neuromuscular re-education, 97535- Self Care, [id number]- Manual therapy, [phone]- Canalith repositioning, [id number]- Aquatic Therapy, [phone]- Electrical stimulation (unattended), [phone]- Electrical stimulation (manual), [id number]- Ionotophoresis 4mg /ml  Dexamethasone , 79439 (1-2 muscles), [id number] (3+ muscles)- Dry Needling, Patient/Family education, Balance training, Stair training, Taping, Joint mobilization, Joint manipulation, Spinal manipulation, Spinal mobilization, Vestibular training, Cryotherapy, and Moist heat.  PLAN FOR NEXT SESSION: LE flexibility, core strengthening, hip strengthening and stability training,  NuStep   Burnard Joy, PT 04/02/24 11:12 AM  [facility] [address] [place], [place] [address] Phone # [phone] Fax [phone]

## 2024-04-02 NOTE — Therapy (Signed)
 OUTPATIENT PHYSICAL THERAPY FEMALE PELVIC TREATMENT   Patient Name: Sherry Reed MRN: [id number] DOB:1950/07/07, 73 y.o., female Today's Date: 04/02/2024  END OF SESSION:  PT End of Session - 04/02/24 0841     Visit Number 11    Date for Recertification  04/22/24    Authorization Type Pelvic auth :[employer] Approved 8 visits-02/14/24-05/14/24-auth#[id number]    Authorization - Visit Number 3    Authorization - Number of Visits 8    Progress Note Due on Visit 10    PT Start Time 0800    PT Stop Time 0840    PT Time Calculation (min) 40 min    Activity Tolerance Patient tolerated treatment well    Behavior During Therapy WFL for tasks assessed/performed            Past Medical History:  Diagnosis Date   Arthritis    knees and hands   Chronic knee pain    right, follows w/ ortho   Female cystocele    grade 2   Hearing loss    bilateral   Hypothyroidism    On thyroid  replacement   Observed sleep apnea    Status post UVPP   Pneumonia    >10 years ago as of 02/12/2023 per pt   Rectocele    grade 1   SOB (shortness of breath)    Stress incontinence    urine   Thyroid  disease    Wears glasses    Wears hearing aid    bilateral   Past Surgical History:  Procedure Laterality Date   ANTERIOR AND POSTERIOR REPAIR N/A 02/22/2023   Procedure: ANTERIOR (CYSTOCELE) AND POSTERIOR REPAIR (RECTOCELE);  Surgeon: Darcel Pool, MD;  Location: [facility];  Service: Gynecology;  Laterality: N/A;   COLONOSCOPY  07/15/2021   ascending colon tubular adenoma   THROAT SURGERY     Uvulopalatopharyngoplasty   TONSILLECTOMY     VAGINAL HYSTERECTOMY N/A 02/22/2023   Procedure: TOTAL HYSTERECTOMY VAGINAL WITH BILATERAL SALPINGECTOMY;  Surgeon: Darcel Pool, MD;  Location: [facility] ;  Service: Gynecology;  Laterality: N/A;   Patient Active Problem List   Diagnosis Date Noted   Abnormal weight gain 07/16/2023   Bilateral hearing loss 07/16/2023    Disorder of bone and articular cartilage 07/16/2023   Diverticulosis of colon 07/16/2023   Pelvic relaxation due to vaginal prolapse 02/22/2023   Otalgia of both ears 06/28/2022   Bloating 05/24/2021   Flatulence 05/24/2021   Abnormal renal function 05/05/2021   Increased frequency of urination 11/23/2020   Hypothyroidism 09/28/2020   Mixed hyperlipidemia 09/28/2020   Vitamin D deficiency 09/28/2020   Gas bloat syndrome 08/25/2020   Fecal smearing 08/25/2020   Change in bowel habits 08/25/2020   Sensorineural hearing loss (SNHL) of both ears 07/23/2018   Irritable bowel syndrome with diarrhea 05/30/2018   Low back pain 02/17/2013   Right knee pain 02/17/2013   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   IUD complication 06/24/2011   Graves' disease 06/24/2011   Osteopenia 06/24/2011   PCP: Shona Norleen PEDLAR, MD  REFERRING PROVIDER: Darcel Pool, MD   REFERRING DIAG: M62.89 (ICD-10-CM) - Other specified disorders of muscle  THERAPY DIAG:  Muscle weakness (generalized)  Unspecified lack of coordination  Rationale for Evaluation and Treatment: Rehabilitation  ONSET DATE: fecal incontinence started 3 months ago, bladder issues for years   SUBJECTIVE:  SUBJECTIVE STATEMENT: Her back is doing pretty good overall. Her pelvic floor is doing better - she got the urge while watching water  boil on the stove one day. She has learned that sugar in general is a major bladder irritant for her. Sometimes walking into the bathroom can cause urgency. Her bowels have been better recently, but she has to be very careful with what she eats.   From eval: Patient reports to PFPT with pelvic floor dysfunction that she learned about from her OBGYN. She is hoping to get stronger in the pelvic floor to prevent  urinary leakage and bowel leakage.  Fluid intake: water  or unsweet tea throughout the day   PERTINENT HISTORY:  Medications for current condition: no longer taking estradiol   Surgeries: hysterectomy last Christmas  Other: N/A Sexual abuse: No  PAIN:  Are you having pain? No NPRS scale: 0/10  PRECAUTIONS: None  RED FLAGS: None   WEIGHT BEARING RESTRICTIONS: No  FALLS:  Has patient fallen in last 6 months? No  OCCUPATION: retired but raises [employer]   ACTIVITY LEVEL : constantly on her feet, no exercise regimen   PLOF: Independent with basic ADLs  PATIENT GOALS: improved urinary control, strengthen the pelvic floor, prevent fecal incontinence   BOWEL MOVEMENT: Pain with bowel movement: No Type of bowel movement:Type (Bristol Stool Scale) 2-3 , Frequency within normal limits , Strain no, and Splinting no Fully empty rectum: No Leakage: Yes: looser in nature, small amount                                                       Caused by: unknown  Pads: Yes: 1x/day Fiber supplement/laxative Yes - probiotic   URINATION: Pain with urination: No Fully empty bladder: Yes:                                  Post-void dribble: No Stream: Strong Urgency: Yes  Frequency:during the day within normal limits                                                          Nocturia: Yes: 1-2x/night   Leakage: Urge to void Pads/briefs: Yes: 1x/night  INTERCOURSE: currently sexually active   Ability to have vaginal penetration Yes  Pain with intercourse: Initial Penetration, During Penetration, and Deep Penetration Dryness: Yes  *tightness present with intercourse, coconut oil is not helping   PREGNANCY: Vaginal deliveries 2 Tearing No Episiotomy No C-section deliveries 0 Currently pregnant No  PROLAPSE: None  OBJECTIVE:  Note: Objective measures were completed at Evaluation unless otherwise noted. PATIENT SURVEYS:  PFIQ-7: 45  COGNITION: Overall cognitive status: Within  functional limits for tasks assessed     SENSATION: Light touch: Appears intact  LUMBAR SPECIAL TESTS:  Single leg stance test: Positive  FUNCTIONAL TESTS:  Single leg stance:  Rt: +  Lt: + Sit-up test: 1/3  Squat: stiffness present with pain in the left hip  Bed mobility: within normal limits   GAIT: Assistive device utilized: None Comments: moderate trendelenburg gait pattern with ambulation   POSTURE: rounded shoulders and  forward head  LUMBARAROM/PROM: within normal limits for all motions bilaterally   LOWER EXTREMITY ROM: within functional limits for all motions with no pain bilaterally   LOWER EXTREMITY MMT: 3+/5 bilateral gluteal and hip musculature with pain in left hip  PALPATION:  General: no tenderness to palpation of bilateral adductors or hip flexors   Pelvic Alignment: within normal limits   Abdominal: abdominal bracing at rest  Diastasis: No Distortion: No  Breathing: apical breathing pattern with decreased lower rib excursion with inhalation  Scar tissue: Yes:                  External Perineal Exam: dryness present with sufficient clitoral hood mobility                              Internal Pelvic Floor: Patient fully consents to today's internal examination. She has normal tone throughout bilateral aspects of superficial and deep pelvic floor musculature, no palpable trigger points. She has a shallow vaginal canal and had no pain during today's exam. She has a weakened pelvic floor contraction that is limited in range of motion due to stiffness. Lack of coordination present between diaphragm and pelvic floor. No pain following today's exam.   Patient confirms identification and approves PT to assess internal pelvic floor and treatment Yes No emotional/communication barriers or cognitive limitation. Patient is motivated to learn. Patient understands and agrees with treatment goals and plan. PT explains patient will be examined in standing, sitting, and  lying down to see how their muscles and joints work. When they are ready, they will be asked to remove their underwear so PT can examine their perineum. The patient is also given the option of providing their own chaperone as one is not provided in our facility. The patient also has the right and is explained the right to defer or refuse any part of the evaluation or treatment including the internal exam. With the patient's consent, PT will use one gloved finger to gently assess the muscles of the pelvic floor, seeing how well it contracts and relaxes and if there is muscle symmetry. After, the patient will get dressed and PT and patient will discuss exam findings and plan of care. PT and patient discuss plan of care, schedule, attendance policy and HEP activities.  PELVIC MMT:   MMT eval  Vaginal 2/5, 10 quick flicks, 3 second hold   Internal Anal Sphincter   External Anal Sphincter   Puborectalis   Diastasis Recti   (Blank rows = not tested)  TONE: Within normal limits bilaterally in superficial and deep pelvic floor musculature   PROLAPSE: None present in hooklying   TODAY'S TREATMENT:                                                                                                                              DATE:   EVAL 02/14/24: Examination completed, findings  reviewed, pt educated on POC, HEP, and self care. Pt motivated to participate in PT and agreeable to attempt recommendations.   Hooklying diaphragmatic breathing + pelvic floor lengthening with inhalation + shortening with exhalation 2x10  Hooklying quick flick pelvic floor contractions + diaphragmatic breathing 2x10   03/20/24: Seated diaphragmatic breathing + pelvic floor lengthening with inhalation + shortening with exhalation 2x10  seated quick flick pelvic floor contractions + diaphragmatic breathing 2x10 Sit to stand + pelvic floor contraction + diaphragmatic breathing 2x10 (to build upon orthopedic HEP also) Urge drill for  managing urge urinary incontinence  Toileting mechanics for optimal emptying of bladder and bowels  Defecation  Urination  Double voiding technique  Blow as you go technique    04/02/24: Seated pelvic floor contraction + diaphragmatic breathing 2x10  Seated quick flick contractions + diaphragmatic breathing 2x10  Bridge + adductor ball squeeze + pelvic floor contraction + diaphragmatic breathing 2x10  Seated abdominal ball press + transverse abdominis contraction + diaphragmatic breathing 2x10  Standing pull down (GTB) + single leg march + diaphragmatic breathing 2x10  Lower trunk rotations + diaphragmatic breathing 2x20  Bladder and bowel irritant education for management of incontinence  Review of urge drill for managing urinary and bowel urgency   PATIENT EDUCATION:  Education details: relative anatomy and the connection between the diaphragm and pelvic floor Person educated: Patient Education method: Explanation, Demonstration, Tactile cues, Verbal cues, and Handouts Education comprehension: verbalized understanding, returned demonstration, verbal cues required, tactile cues required, and needs further education  HOME EXERCISE PROGRAM: Access Code: [email or web address] URL: [email or web address] Date: 04/02/2024 Prepared by: Celena Domino  Exercises - Seated Pelvic Floor Contraction  - 1 x daily - 7 x weekly - 2 sets - 10 reps - Seated Quick Flick Pelvic Floor Contractions  - 1 x daily - 7 x weekly - 2 sets - 10 reps - Supine Bridge with Mini Swiss Ball Between Knees  - 1 x daily - 7 x weekly - 2 sets - 10 reps - Sit to Stand with Mercer Between Knees  - 1 x daily - 7 x weekly - 2 sets - 10 reps - Seated Abdominal Press into [employer]  - 1 x daily - 7 x weekly - 2 sets - 10 reps - Resistance Pulldown with March  - 1 x daily - 7 x weekly - 2 sets - 10 reps  ASSESSMENT:  CLINICAL IMPRESSION: Patient is a 73 y.o. female  who was seen today for physical therapy treatment for  urinary and fecal incontinence. Patient reports that her leakage has been better, she will still be triggered by the bathroom or the sound of running water . Urge drill reviewed and pt educated on bladder and bowel irritants thoroughly. She tolerated core strengthening progressions very well today with moderate cueing required from PT. Pt tolerated session well and Pt would benefit from additional PT to further address deficits.    OBJECTIVE IMPAIRMENTS: decreased coordination, decreased endurance, decreased mobility, difficulty walking, decreased ROM, decreased strength, and pain.   ACTIVITY LIMITATIONS: continence and toileting  PARTICIPATION LIMITATIONS: community activity  PERSONAL FACTORS: Age, Past/current experiences, and Time since onset of injury/illness/exacerbation are also affecting patient's functional outcome.   REHAB POTENTIAL: Good  CLINICAL DECISION MAKING: Stable/uncomplicated  EVALUATION COMPLEXITY: Low   GOALS: Goals reviewed with patient? Yes  SHORT TERM GOALS: Target date: 03/13/2024  Pt will be independent with HEP.  Baseline: Goal status: INITIAL  2.  Pt will be independent with the knack, urge suppression  technique, and double voiding in order to improve bladder habits and decrease urinary incontinence.  Baseline:  Goal status: INITIAL  3.  Pt will be independent with use of squatty potty, relaxed toileting mechanics, and improved bowel movement techniques in order to increase ease of bowel movements and complete evacuation.  Baseline:  Goal status: INITIAL  LONG TERM GOALS: Target date: 08/14/2024  Pt will be independent with advanced HEP.  Baseline:  Goal status: INITIAL  2.  Pt to demonstrate improved coordination of pelvic floor and breathing mechanics with 10# squat with appropriate synergistic patterns to decrease pain and leakage at least 75% of the time for improved ability to complete a 30 minute workout with strain at pelvic floor and symptoms.   Baseline:  Goal status: INITIAL  3.  Pt will report her BMs are complete due to improved bowel habits and evacuation techniques.  Baseline:  Goal status: INITIAL  4.  Pt will have greater than 5 bowel movements a week in order to feel more comfortable and decrease pressure on urinogenital system.   Baseline:  Goal status: INITIAL  5.  Pt will report 0/10 pain with vaginal penetration in order to improve intimate relationship with partner.    Baseline:  Goal status: INITIAL  PLAN:  PT FREQUENCY: 1-2x/week  PT DURATION: 6 months  PLANNED INTERVENTIONS: 97110-Therapeutic exercises, 97530- Therapeutic activity, 97112- Neuromuscular re-education, 97535- Self Care, [id number]- Manual therapy, Patient/Family education, Taping, Joint mobilization, Spinal mobilization, Scar mobilization, Cryotherapy, Moist heat, and Biofeedback  PLAN FOR NEXT SESSION: continued pelvic floor training in seated, introduce toileting mechanics for optimal emptying, urge drill and knack drill, hip/glute/core strengthening  Celena JAYSON Domino, PT 04/02/2024, 8:43 AM

## 2024-04-09 ENCOUNTER — Encounter: Admitting: Physical Therapy

## 2024-04-14 ENCOUNTER — Ambulatory Visit: Attending: Physician Assistant

## 2024-04-14 DIAGNOSIS — R262 Difficulty in walking, not elsewhere classified: Secondary | ICD-10-CM | POA: Insufficient documentation

## 2024-04-14 DIAGNOSIS — R252 Cramp and spasm: Secondary | ICD-10-CM | POA: Diagnosis present

## 2024-04-14 DIAGNOSIS — M5459 Other low back pain: Secondary | ICD-10-CM | POA: Diagnosis present

## 2024-04-14 DIAGNOSIS — M6281 Muscle weakness (generalized): Secondary | ICD-10-CM | POA: Insufficient documentation

## 2024-04-14 NOTE — Therapy (Signed)
 OUTPATIENT PHYSICAL THERAPY TREATMENT    Patient Name: Sherry Reed MRN: [id number] DOB:[birth year], 73 y.o., female Today's Date: 04/14/2024   END OF SESSION:  PT End of Session - 04/14/24 1032     Visit Number 13   10 ortho   PT Start Time 1027    PT Stop Time 1100    PT Time Calculation (min) 33 min    Activity Tolerance Patient tolerated treatment well    Behavior During Therapy WFL for tasks assessed/performed               Past Medical History:  Diagnosis Date   Arthritis    knees and hands   Chronic knee pain    right, follows w/ ortho   Female cystocele    grade 2   Hearing loss    bilateral   Hypothyroidism    On thyroid  replacement   Observed sleep apnea    Status post UVPP   Pneumonia    >10 years ago as of 02/12/2023 per pt   Rectocele    grade 1   SOB (shortness of breath)    Stress incontinence    urine   Thyroid  disease    Wears glasses    Wears hearing aid    bilateral   Past Surgical History:  Procedure Laterality Date   ANTERIOR AND POSTERIOR REPAIR N/A 02/22/2023   Procedure: ANTERIOR (CYSTOCELE) AND POSTERIOR REPAIR (RECTOCELE);  Surgeon: Darcel Pool, MD;  Location: [facility];  Service: Gynecology;  Laterality: N/A;   COLONOSCOPY  07/15/2021   ascending colon tubular adenoma   THROAT SURGERY     Uvulopalatopharyngoplasty   TONSILLECTOMY     VAGINAL HYSTERECTOMY N/A 02/22/2023   Procedure: TOTAL HYSTERECTOMY VAGINAL WITH BILATERAL SALPINGECTOMY;  Surgeon: Darcel Pool, MD;  Location: [facility];  Service: Gynecology;  Laterality: N/A;   Patient Active Problem List   Diagnosis Date Noted   Abnormal weight gain 07/16/2023   Bilateral hearing loss 07/16/2023   Disorder of bone and articular cartilage 07/16/2023   Diverticulosis of colon 07/16/2023   Pelvic relaxation due to vaginal prolapse 02/22/2023   Otalgia of both ears 06/28/2022   Bloating 05/24/2021   Flatulence 05/24/2021    Abnormal renal function 05/05/2021   Increased frequency of urination 11/23/2020   Hypothyroidism 09/28/2020   Mixed hyperlipidemia 09/28/2020   Vitamin D deficiency 09/28/2020   Gas bloat syndrome 08/25/2020   Fecal smearing 08/25/2020   Change in bowel habits 08/25/2020   Sensorineural hearing loss (SNHL) of both ears 07/23/2018   Irritable bowel syndrome with diarrhea 05/30/2018   Low back pain 02/17/2013   Right knee pain 02/17/2013   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   IUD complication 06/24/2011   Graves' disease 06/24/2011   Osteopenia 06/24/2011    PCP: Shona Rush, MD  REFERRING PROVIDER: Gretta Forte, PA  REFERRING DIAG:  Diagnosis  M25.551 (ICD-10-CM) - Pain in right hip  M54.50 (ICD-10-CM) - Lumbar pain    Rationale for Evaluation and Treatment: Rehabilitation  THERAPY DIAG:  Muscle weakness (generalized)  Other low back pain  Difficulty in walking, not elsewhere classified  Cramp and spasm  ONSET DATE: September 2025  SUBJECTIVE:  SUBJECTIVE STATEMENT: My Rt leg is 90% better. I haven't had back pain.  I'm so much better.     From MD note: [name] returns today status post trigger point injection right sacral region 02/18/2024. She states she got some relief from this. But now pain is more mid sacrum area but she also describes that she is unable to lie on her right hip due to pain lateral aspect of the hip at times. No real radicular symptoms down the leg. Denies any numbness tingling. No groin pain. Difficulty ambulating due to the pain in the hip.   PERTINENT HISTORY:  Knee pain, stress incontinence  PAIN: 04/14/24 Are you having pain? Yes: NPRS scale: 0/10 Pain location: Rt sacrum, gluteals and lateral leg Pain description: hurting, intense,  constant Aggravating factors: movement, standing and walking  Relieving factors: shift to Lt, not moving   PRECAUTIONS: None  RED FLAGS: None   WEIGHT BEARING RESTRICTIONS: No  FALLS:  Has patient fallen in last 6 months? No  LIVING ENVIRONMENT: Lives with: lives with their spouse Lives in: House/apartment Stairs: No Has following equipment at home: None  OCCUPATION: retired   PLOF: Independent, Vocation/Vocational requirements: retired, and Leisure: care fore chestnuts   PATIENT GOALS: reduce pain, care for [employer]  NEXT MD VISIT:   OBJECTIVE:  Note: Objective measures were completed at Evaluation unless otherwise noted.  DIAGNOSTIC FINDINGS:  02/20/24: X-ray:  AP pelvis lateral view of the right hip: Bilateral hips well located.  Hip  joints are well-maintained bilaterally.  Sclerotic changes over the  trochanteric regions left greater than right.  No acute fractures acute  findings.   PATIENT SURVEYS:  02/26/24: LEFS: 20/80=25% 03/31/24: 28/80  COGNITION: Overall cognitive status: Within functional limits for tasks assessed     SENSATION: WFL  MUSCLE LENGTH: Hamstring length reduced by 25%   POSTURE: rounded shoulders, forward head, and weight shift left  PALPATION: Tension in Rt gluteals and pain at Rt SI joint  LUMBAR ROM:  WFLs without pain  LOWER EXTREMITY ROM:     Bil hip flexibility limited by 25% limitation bil.  Lateral Rt hip pain with end range hip adduction and flexion   LOWER EXTREMITY MMT:    4+/5 bil hips 5/5 knees, 5/5 ankles   LUMBAR SPECIAL TESTS:  Straight leg raise test: Negative  FUNCTIONAL TESTS:  Max UE support with sit to stand due to Rt LE pain  GAIT: Distance walked: 50 Assistive device utilized: None Level of assistance: Complete Independence Comments: significant antalgia with reduced time on Rt LE   TREATMENT DATE:   04/14/24:  NuStep: Level 5 x 8 minutes-PT present to discuss progress Standing hamstring  stretch at stairs 2 x 30 sec each LE- used power plate Sit to stand 1 x 10 with 8 lb db-good control, staggered stance Rt and Lt x10 each  Sidelying clam and reverse clam (added yellow band) 2x10  Seated piriformis stretch 2x20 seconds bil   04/02/24:  NuStep: Level 5 x 5 minutes-PT present to discuss progress Standing hamstring stretch at stairs 2 x 30 sec each LE- used power plate Sit to stand 1 x 10 with 8 lb db-good control, staggered stance Rt and Lt x10 each  Seated long arc quads: 3# added 2x10 with TA activation-no weight on Lt due to knee pain with this. Standing marching on balance pad: 3# on Rt, 2# on Lt  2x10 bil each  Standing on balance pad: bil hip abduction and extension 2x10  Alternating step taps on 6 step with 5# KB farmer's carry x20 each  Seated piriformis stretch 2x20 seconds bil   03/31/24:  NuStep: Level 5 x 5 minutes-PT present to discuss progress Standing hamstring stretch at stairs 2 x 30 sec each LE- used power plate Sit to stand 1 x 10 with 8 lb db-good control, staggered stance Rt and Lt x5 each  Seated long arc quads: 3# added 2x10 with TA activation-no weight on Lt due to knee pain with this. Seated marching: 3# on Rt, 2# on Lt  2x10 bil each  Standing on balance pad: bil hip abduction and extension 2x10  Sidestepping with blue loop around thighs x3 laps along baree Alternating step taps on 6 step with 5# KB farmer's carry x20 each  Seated piriformis stretch 2x20 seconds bil   PATIENT EDUCATION:  Education details: Access Code: [email or web address], activity modification (03/05/24), body mechanics (03/26/24) Person educated: Patient Education method: Explanation, Demonstration, and Handouts Education comprehension: verbalized understanding and returned demonstration  HOME EXERCISE PROGRAM: Access Code: [email or web address] URL: [email or web address] Date: 04/14/2024 Prepared by: Burnard  Exercises - Seated Figure 4 Piriformis Stretch  - 3 x daily - 7 x  weekly - 3 sets - 10 reps - Seated Hamstring Stretch  - 3 x daily - 7 x weekly - 3 sets - 10 reps - Supine Hip Internal and External Rotation  - 2 x daily - 7 x weekly - 1 sets - 10 reps - 5 hold - Sit to Stand Without Arm Support  - 1-2 x daily - 7 x weekly - 1 sets - 5-10 reps - Clamshell  - 1 x daily - 7 x weekly - 2 sets - 10 reps - Sidelying Reverse Clamshell  - 1 x daily - 7 x weekly - 2 sets - 10 reps - Supine Posterior Pelvic Tilt  - 1 x daily - 7 x weekly - 1 sets - 20 reps - Supine 90/90 Alternating Heel Touches with Posterior Pelvic Tilt  - 1 x daily - 7 x weekly - 1 sets - 20 reps - Supine Dead Bug with Leg Extension  - 1 x daily - 7 x weekly - 1 sets - 20 reps  ASSESSMENT:  CLINICAL IMPRESSION: Pt reports 90% overall improvement in symptoms since the start of care.  No LBP reported.  LEFS is improved to 56/80, meeting goal.  PT monitored throughout session for technique and emphasized compliance with HEP.  Review of all HEP today using visuals.    Pt will D/C to HEP today.   OBJECTIVE IMPAIRMENTS: Abnormal gait, decreased activity tolerance, difficulty walking, decreased ROM, increased fascial restrictions, impaired perceived functional ability, increased muscle spasms, improper body mechanics, postural dysfunction, and pain.   ACTIVITY LIMITATIONS: carrying, lifting, standing, squatting, transfers, hygiene/grooming, and locomotion level  PARTICIPATION LIMITATIONS: meal prep, cleaning, shopping, and community activity  PERSONAL FACTORS: Age and 1-2 comorbidities: incontinence, bursitis are also affecting patient's functional outcome.   REHAB POTENTIAL: Good  CLINICAL DECISION MAKING: Stable/uncomplicated  EVALUATION COMPLEXITY: Low   GOALS: Goals reviewed with patient? Yes  SHORT TERM GOALS: Target date: 03/25/2024    Be independent in initial HEP Baseline:03/26/24 Goal status: MET   2.  Demonstrate symmetry with ambulation on level surface due to reduced  pain Baseline: improved symmetry today (03/05/24) Goal status: In progress  3.  Perform sit to stand with Rt=Lt weightbearing and no UE support due to reduced pain Baseline:  Goal status: MET 03/12/24  4.  Report <  or = to 6/10 Rt LE pain with standing and walking  Baseline: 4-5/10 (03/24/24) Goal status: In progress   5. Improve LEFS to > or = to 30/80   Baseline: 28/80 (03/31/24)  Goal status: in progress    LONG TERM GOALS: Target date: 04/22/2024    Be independent in advanced HEP Baseline: 04/14/24 Goal status: MET  2.  Improve LEFS to > or = to 50/80  Baseline: 56/80 12/1/125 Goal status: MET  3.  Report > or = to 70% reduction in Rt LE pain with standing and walking  Baseline: 90% 04/14/24 Goal status: MET  4.  Return to care of chestnuts without limitation due to Rt LE pain Baseline: no limitations with daily tasks and has not returned to caring for chestnuts (04/14/24) Goal status: In progress   5.  Stand and walk without limitation at home and in the community Baseline: no limitations  Goal status: MET  PLAN: PHYSICAL THERAPY DISCHARGE SUMMARY  Visits from Start of Care: 10  Current functional level related to goals / functional outcomes: Pt reports 90% overall reduction in pain since the start of care.  No functional deficits remain.     Remaining deficits: No deficits now.  Some intermittent Rt hip and low back pain related to activity and PT emphasized the importance of compliance with HEP.    Education / Equipment: HEP, posture/mechanics    Patient agrees to discharge. Patient goals were met. Patient is being discharged due to meeting the stated rehab goals.    Burnard Joy, PT 04/14/24 11:01 AM  [facility] [address] [place], [place] [address] Phone # [phone] Fax [phone]

## 2024-04-15 ENCOUNTER — Encounter: Admitting: Physical Therapy

## 2024-04-16 ENCOUNTER — Ambulatory Visit

## 2024-04-25 ENCOUNTER — Ambulatory Visit: Attending: Obstetrics and Gynecology | Admitting: Physical Therapy

## 2024-04-25 DIAGNOSIS — R279 Unspecified lack of coordination: Secondary | ICD-10-CM | POA: Diagnosis present

## 2024-04-25 DIAGNOSIS — M5459 Other low back pain: Secondary | ICD-10-CM | POA: Diagnosis present

## 2024-04-25 DIAGNOSIS — R293 Abnormal posture: Secondary | ICD-10-CM | POA: Insufficient documentation

## 2024-04-25 DIAGNOSIS — M6281 Muscle weakness (generalized): Secondary | ICD-10-CM | POA: Diagnosis present

## 2024-04-25 NOTE — Therapy (Signed)
 " OUTPATIENT PHYSICAL THERAPY FEMALE PELVIC TREATMENT/Re-Evaluation   Patient Name: Sherry Reed MRN: [id number] DOB:1950-06-10, 73 y.o., female Today's Date: 04/25/2024  END OF SESSION:  PT End of Session - 04/25/24 0831     Visit Number 14    Authorization - Visit Number 5    Authorization - Number of Visits 8    PT Start Time 0800    PT Stop Time 0845    PT Time Calculation (min) 45 min    Activity Tolerance Patient tolerated treatment well    Behavior During Therapy WFL for tasks assessed/performed             Past Medical History:  Diagnosis Date   Arthritis    knees and hands   Chronic knee pain    right, follows w/ ortho   Female cystocele    grade 2   Hearing loss    bilateral   Hypothyroidism    On thyroid  replacement   Observed sleep apnea    Status post UVPP   Pneumonia    >10 years ago as of 02/12/2023 per pt   Rectocele    grade 1   SOB (shortness of breath)    Stress incontinence    urine   Thyroid  disease    Wears glasses    Wears hearing aid    bilateral   Past Surgical History:  Procedure Laterality Date   ANTERIOR AND POSTERIOR REPAIR N/A 02/22/2023   Procedure: ANTERIOR (CYSTOCELE) AND POSTERIOR REPAIR (RECTOCELE);  Surgeon: Darcel Pool, MD;  Location: [facility];  Service: Gynecology;  Laterality: N/A;   COLONOSCOPY  07/15/2021   ascending colon tubular adenoma   THROAT SURGERY     Uvulopalatopharyngoplasty   TONSILLECTOMY     VAGINAL HYSTERECTOMY N/A 02/22/2023   Procedure: TOTAL HYSTERECTOMY VAGINAL WITH BILATERAL SALPINGECTOMY;  Surgeon: Darcel Pool, MD;  Location: [facility] [place];  Service: Gynecology;  Laterality: N/A;   Patient Active Problem List   Diagnosis Date Noted   Abnormal weight gain 07/16/2023   Bilateral hearing loss 07/16/2023   Disorder of bone and articular cartilage 07/16/2023   Diverticulosis of colon 07/16/2023   Pelvic relaxation due to vaginal prolapse 02/22/2023    Otalgia of both ears 06/28/2022   Bloating 05/24/2021   Flatulence 05/24/2021   Abnormal renal function 05/05/2021   Increased frequency of urination 11/23/2020   Hypothyroidism 09/28/2020   Mixed hyperlipidemia 09/28/2020   Vitamin D deficiency 09/28/2020   Gas bloat syndrome 08/25/2020   Fecal smearing 08/25/2020   Change in bowel habits 08/25/2020   Sensorineural hearing loss (SNHL) of both ears 07/23/2018   Irritable bowel syndrome with diarrhea 05/30/2018   Low back pain 02/17/2013   Right knee pain 02/17/2013   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   IUD complication 06/24/2011   Graves' disease 06/24/2011   Osteopenia 06/24/2011   PCP: Shona Norleen PEDLAR, MD  REFERRING PROVIDER: Darcel Pool, MD   REFERRING DIAG: M62.89 (ICD-10-CM) - Other specified disorders of muscle  THERAPY DIAG:  Muscle weakness (generalized)  Other low back pain  Unspecified lack of coordination  Abnormal posture  Rationale for Evaluation and Treatment: Rehabilitation  ONSET DATE: fecal incontinence started 3 months ago, bladder issues for years   SUBJECTIVE:  SUBJECTIVE STATEMENT: Back is feeling good this morning, this fluctuates depending on the day. Her bladder has been behaving - she notices if she drinks a lot of bladder irritants she will have difficulty. These irritants cause urgency. Bowels have been good recently.   From eval: Patient reports to PFPT with pelvic floor dysfunction that she learned about from her OBGYN. She is hoping to get stronger in the pelvic floor to prevent urinary leakage and bowel leakage.  Fluid intake: water  or unsweet tea throughout the day   PERTINENT HISTORY:  Medications for current condition: no longer taking estradiol   Surgeries: hysterectomy last Christmas   Other: N/A Sexual abuse: No  PAIN:  Are you having pain? No NPRS scale: 0/10  PRECAUTIONS: None  RED FLAGS: None   WEIGHT BEARING RESTRICTIONS: No  FALLS:  Has patient fallen in last 6 months? No  OCCUPATION: retired but raises [employer]   ACTIVITY LEVEL : constantly on her feet, no exercise regimen   PLOF: Independent with basic ADLs  PATIENT GOALS: improved urinary control, strengthen the pelvic floor, prevent fecal incontinence   BOWEL MOVEMENT: Pain with bowel movement: No Type of bowel movement:Type (Bristol Stool Scale) 2-3 , Frequency within normal limits , Strain no, and Splinting no Fully empty rectum: No Leakage: Yes: looser in nature, small amount                                                       Caused by: unknown  Pads: Yes: 1x/day Fiber supplement/laxative Yes - probiotic   URINATION: Pain with urination: No Fully empty bladder: Yes:                                  Post-void dribble: No Stream: Strong Urgency: Yes  Frequency:during the day within normal limits                                                          Nocturia: Yes: 1-2x/night   Leakage: Urge to void Pads/briefs: Yes: 1x/night  INTERCOURSE: currently sexually active   Ability to have vaginal penetration Yes  Pain with intercourse: Initial Penetration, During Penetration, and Deep Penetration Dryness: Yes  *tightness present with intercourse, coconut oil is not helping   PREGNANCY: Vaginal deliveries 2 Tearing No Episiotomy No C-section deliveries 0 Currently pregnant No  PROLAPSE: None  OBJECTIVE:  Note: Objective measures were completed at Evaluation unless otherwise noted. PATIENT SURVEYS:  PFIQ-7: 45 04/25/24: 25  COGNITION: Overall cognitive status: Within functional limits for tasks assessed     SENSATION: Light touch: Appears intact  LUMBAR SPECIAL TESTS:  Single leg stance test: Positive  FUNCTIONAL TESTS:  Single leg stance:  Rt: +  Lt: + Sit-up  test: 1/3  Squat: stiffness present with pain in the left hip  Bed mobility: within normal limits   GAIT: Assistive device utilized: None Comments: moderate trendelenburg gait pattern with ambulation   POSTURE: rounded shoulders and forward head  LUMBARAROM/PROM: within normal limits for all motions bilaterally   LOWER EXTREMITY ROM: within functional limits for all motions with  no pain bilaterally   LOWER EXTREMITY MMT: 3+/5 bilateral gluteal and hip musculature with pain in left hip  PALPATION:  General: no tenderness to palpation of bilateral adductors or hip flexors   Pelvic Alignment: within normal limits   Abdominal: abdominal bracing at rest  Diastasis: No Distortion: No  Breathing: apical breathing pattern with decreased lower rib excursion with inhalation  Scar tissue: Yes:                  External Perineal Exam: dryness present with sufficient clitoral hood mobility                              Internal Pelvic Floor: Patient fully consents to today's internal examination. She has normal tone throughout bilateral aspects of superficial and deep pelvic floor musculature, no palpable trigger points. She has a shallow vaginal canal and had no pain during today's exam. She has a weakened pelvic floor contraction that is limited in range of motion due to stiffness. Lack of coordination present between diaphragm and pelvic floor. No pain following today's exam.   Patient confirms identification and approves PT to assess internal pelvic floor and treatment Yes No emotional/communication barriers or cognitive limitation. Patient is motivated to learn. Patient understands and agrees with treatment goals and plan. PT explains patient will be examined in standing, sitting, and lying down to see how their muscles and joints work. When they are ready, they will be asked to remove their underwear so PT can examine their perineum. The patient is also given the option of providing their own  chaperone as one is not provided in our facility. The patient also has the right and is explained the right to defer or refuse any part of the evaluation or treatment including the internal exam. With the patient's consent, PT will use one gloved finger to gently assess the muscles of the pelvic floor, seeing how well it contracts and relaxes and if there is muscle symmetry. After, the patient will get dressed and PT and patient will discuss exam findings and plan of care. PT and patient discuss plan of care, schedule, attendance policy and HEP activities.  PELVIC MMT:   MMT eval Re-eval 04/25/24  Vaginal 2/5, 10 quick flicks, 3 second hold  3/5, 10 quick flicks, 5 second hold  Internal Anal Sphincter    External Anal Sphincter    Puborectalis    Diastasis Recti    (Blank rows = not tested)  TONE: Within normal limits bilaterally in superficial and deep pelvic floor musculature   PROLAPSE: None present in hooklying   TODAY'S TREATMENT:                                                                                                                              DATE:   03/20/24: Seated diaphragmatic breathing + pelvic floor lengthening with inhalation + shortening with exhalation 2x10  seated quick flick pelvic floor contractions + diaphragmatic breathing 2x10 Sit to stand + pelvic floor contraction + diaphragmatic breathing 2x10 (to build upon orthopedic HEP also) Urge drill for managing urge urinary incontinence  Toileting mechanics for optimal emptying of bladder and bowels  Defecation  Urination  Double voiding technique  Blow as you go technique    04/02/24: Seated pelvic floor contraction + diaphragmatic breathing 2x10  Seated quick flick contractions + diaphragmatic breathing 2x10  Bridge + adductor ball squeeze + pelvic floor contraction + diaphragmatic breathing 2x10  Seated abdominal ball press + transverse abdominis contraction + diaphragmatic breathing 2x10  Standing pull  down (GTB) + single leg march + diaphragmatic breathing 2x10  Lower trunk rotations + diaphragmatic breathing 2x20  Bladder and bowel irritant education for management of incontinence  Review of urge drill for managing urinary and bowel urgency   04/25/24: Discussion of goals and re-evaluation of pelvic floor  Seated pelvic floor contraction + diaphragmatic breathing x81min  Standing pelvic floor contraction + diaphragmatic breathing x56min  Seated quick flick contractions + diaphragmatic breathing x20 Standing quick flick contractions + diaphragmatic breathing x20  Reevaluation of pelvic floor  Patient confirms identification and approves physical therapist to perform internal soft tissue work   PATIENT EDUCATION:  Education details: relative anatomy and the connection between the diaphragm and pelvic floor Person educated: Patient Education method: Explanation, Demonstration, Tactile cues, Verbal cues, and Handouts Education comprehension: verbalized understanding, returned demonstration, verbal cues required, tactile cues required, and needs further education  HOME EXERCISE PROGRAM: Access Code: [email or web address] URL: [email or web address] Date: 04/25/2024 Prepared by: Celena Domino  Exercises - Seated Pelvic Floor Contraction  - 1 x daily - 7 x weekly - 2 sets - 10 reps - Standing Pelvic Floor Contraction  - 1 x daily - 7 x weekly - 1 sets - 10 reps - Seated Quick Flick Pelvic Floor Contractions  - 1 x daily - 7 x weekly - 2 sets - 20 reps - Supine Bridge with Mini Swiss Ball Between Knees  - 1 x daily - 7 x weekly - 2 sets - 10 reps - Sit to Stand with Ball Between Knees  - 1 x daily - 7 x weekly - 2 sets - 10 reps - Seated Abdominal Press into [employer]  - 1 x daily - 7 x weekly - 2 sets - 10 reps - Resistance Pulldown with March  - 1 x daily - 7 x weekly - 2 sets - 10 reps  ASSESSMENT:  CLINICAL IMPRESSION: Patient is a 73 y.o. female  who was seen today for physical  therapy treatment for urinary and fecal incontinence. She has attended 4 sessions of PFPT. Patient reports that her leakage has been better, she will still be triggered by bladder irritants and warm drinks (tea). All pelvic floor exercises reviewed today and progressed to standing position. Pelvic floor recheck revealed improved coordination in the pelvic floor musculature and improved strength, yet pt is still lacking deep pelvic floor strength and has restriction present around cervix from hysterectomy. Pt is still feeling insecurity around bowel and bladder control and would benefit from continued PT to further address deficits.    OBJECTIVE IMPAIRMENTS: decreased coordination, decreased endurance, decreased mobility, difficulty walking, decreased ROM, decreased strength, and pain.   ACTIVITY LIMITATIONS: continence and toileting  PARTICIPATION LIMITATIONS: community activity  PERSONAL FACTORS: Age, Past/current experiences, and Time since onset of injury/illness/exacerbation are also affecting patient's functional outcome.   REHAB POTENTIAL: Good  CLINICAL  DECISION MAKING: Stable/uncomplicated  EVALUATION COMPLEXITY: Low   GOALS: Goals reviewed with patient? Yes  SHORT TERM GOALS: Target date: 03/13/2024  Pt will be independent with HEP.  Baseline: Goal status: GOAL MET 04/25/24  2.  Pt will be independent with the knack, urge suppression technique, and double voiding in order to improve bladder habits and decrease urinary incontinence.  Baseline:  Goal status: GOAL MET 04/25/24  3.  Pt will be independent with use of squatty potty, relaxed toileting mechanics, and improved bowel movement techniques in order to increase ease of bowel movements and complete evacuation.  Baseline:  Goal status: GOAL MET 04/25/24  LONG TERM GOALS: Target date: 08/14/2024  Pt will be independent with advanced HEP.  Baseline:  Goal status: ONGOING 04/25/24  2.  Pt to demonstrate improved  coordination of pelvic floor and breathing mechanics with 10# squat with appropriate synergistic patterns to decrease pain and leakage at least 75% of the time for improved ability to complete a 30 minute workout with strain at pelvic floor and symptoms.  Baseline:  Goal status: ONGOING 04/25/24  3.  Pt will report her BMs are complete due to improved bowel habits and evacuation techniques.  Baseline:  Goal status: GOAL MET 04/25/24  4.  Pt will have greater than 5 bowel movements a week in order to feel more comfortable and decrease pressure on urinogenital system.   Baseline:  Goal status: GOAL MET 04/25/24  5.  Pt will report 0/10 pain with vaginal penetration in order to improve intimate relationship with partner.    Baseline:  Goal status: ONGOING 04/25/24  PLAN:  PT FREQUENCY: 1-2x/week  PT DURATION: 6 months  PLANNED INTERVENTIONS: 97110-Therapeutic exercises, 97530- Therapeutic activity, 97112- Neuromuscular re-education, 97535- Self Care, [id number]- Manual therapy, Patient/Family education, Taping, Joint mobilization, Spinal mobilization, Scar mobilization, Cryotherapy, Moist heat, and Biofeedback  PLAN FOR NEXT SESSION: continued pelvic floor training in seated, introduce toileting mechanics for optimal emptying, urge drill and knack drill, hip/glute/core strengthening  Celena JAYSON Domino, PT 04/25/2024, 8:31 AM  "

## 2024-05-02 ENCOUNTER — Encounter: Admitting: Physical Therapy

## 2024-05-22 ENCOUNTER — Ambulatory Visit: Admitting: Physical Therapy

## 2024-06-11 ENCOUNTER — Other Ambulatory Visit

## 2024-07-02 ENCOUNTER — Ambulatory Visit: Admitting: Physical Therapy
